# Patient Record
Sex: Male | Born: 2000 | Hispanic: Yes | Marital: Single | Smoking: Never smoker
Health system: Southern US, Community
[De-identification: ages and names within clinical notes are randomized; demographics above are authoritative.]

---
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
## 2020-07-13 ENCOUNTER — Other Ambulatory Visit: Payer: Self-pay

## 2020-07-13 ENCOUNTER — Emergency Department: Payer: Medicaid Other

## 2020-07-13 ENCOUNTER — Inpatient Hospital Stay
Admission: EM | Admit: 2020-07-13 | Discharge: 2020-07-17 | DRG: 316 | Disposition: A | Discharge status: Home / Self Care | Payer: Medicaid Other | Attending: Internal Medicine | Admitting: Internal Medicine

## 2020-07-13 DIAGNOSIS — F329 Major depressive disorder, single episode, unspecified: Secondary | ICD-10-CM | POA: Diagnosis present

## 2020-07-13 DIAGNOSIS — Z20822 Contact with and (suspected) exposure to covid-19: Secondary | ICD-10-CM | POA: Diagnosis present

## 2020-07-13 DIAGNOSIS — R778 Other specified abnormalities of plasma proteins: Secondary | ICD-10-CM | POA: Diagnosis present

## 2020-07-13 DIAGNOSIS — I4510 Unspecified right bundle-branch block: Secondary | ICD-10-CM | POA: Diagnosis present

## 2020-07-13 DIAGNOSIS — B349 Viral infection, unspecified: Secondary | ICD-10-CM | POA: Diagnosis present

## 2020-07-13 DIAGNOSIS — I301 Infective pericarditis: Secondary | ICD-10-CM | POA: Diagnosis present

## 2020-07-13 DIAGNOSIS — I309 Acute pericarditis, unspecified: Secondary | ICD-10-CM | POA: Diagnosis present

## 2020-07-13 DIAGNOSIS — F419 Anxiety disorder, unspecified: Secondary | ICD-10-CM | POA: Diagnosis present

## 2020-07-13 DIAGNOSIS — I319 Disease of pericardium, unspecified: Secondary | ICD-10-CM

## 2020-07-13 DIAGNOSIS — Z79899 Other long term (current) drug therapy: Secondary | ICD-10-CM

## 2020-07-13 DIAGNOSIS — I300 Acute nonspecific idiopathic pericarditis: Secondary | ICD-10-CM | POA: Diagnosis not present

## 2020-07-13 DIAGNOSIS — R7989 Other specified abnormal findings of blood chemistry: Secondary | ICD-10-CM

## 2020-07-13 DIAGNOSIS — R000 Tachycardia, unspecified: Secondary | ICD-10-CM | POA: Diagnosis present

## 2020-07-13 DIAGNOSIS — I10 Essential (primary) hypertension: Secondary | ICD-10-CM | POA: Diagnosis present

## 2020-07-13 DIAGNOSIS — E876 Hypokalemia: Secondary | ICD-10-CM | POA: Diagnosis present

## 2020-07-13 DIAGNOSIS — R9431 Abnormal electrocardiogram [ECG] [EKG]: Secondary | ICD-10-CM | POA: Diagnosis present

## 2020-07-13 LAB — BASIC METABOLIC PANEL
Anion gap: 14 (ref 5–15)
BUN: 13 mg/dL (ref 6–20)
CO2: 20 mmol/L — ABNORMAL LOW (ref 22–32)
Calcium: 9.4 mg/dL (ref 8.9–10.3)
Chloride: 100 mmol/L (ref 98–111)
Creatinine, Ser: 1.03 mg/dL (ref 0.61–1.24)
GFR calc Af Amer: >60 mL/min (ref >60)
GFR calc non Af Amer: >60 mL/min (ref >60)
Glucose, Bld: 122 mg/dL — ABNORMAL HIGH (ref 70–99)
Potassium: 3.2 mmol/L — ABNORMAL LOW (ref 3.5–5.1)
Sodium: 134 mmol/L — ABNORMAL LOW (ref 135–145)

## 2020-07-13 LAB — TROPONIN I (HIGH SENSITIVITY): Troponin I (High Sensitivity): 4684 ng/L (ref <18)

## 2020-07-13 LAB — CBC WITH DIFFERENTIAL/PLATELET
Abs Immature Granulocytes: 0.03 10*3/uL (ref 0.00–0.07)
Basophils Absolute: 0.1 10*3/uL (ref 0.0–0.1)
Basophils Relative: 1 %
Eosinophils Absolute: 0 10*3/uL (ref 0.0–0.5)
Eosinophils Relative: 0 %
HCT: 49.6 % (ref 39.0–52.0)
Hemoglobin: 16.8 g/dL (ref 13.0–17.0)
Immature Granulocytes: 0 %
Lymphocytes Relative: 23 %
Lymphs Abs: 2.2 10*3/uL (ref 0.7–4.0)
MCH: 29.9 pg (ref 26.0–34.0)
MCHC: 33.9 g/dL (ref 30.0–36.0)
MCV: 88.4 fL (ref 80.0–100.0)
Monocytes Absolute: 1.1 10*3/uL — ABNORMAL HIGH (ref 0.1–1.0)
Monocytes Relative: 12 %
Neutro Abs: 6.1 10*3/uL (ref 1.7–7.7)
Neutrophils Relative %: 64 %
Platelets: 289 10*3/uL (ref 150–400)
RBC: 5.61 MIL/uL (ref 4.22–5.81)
RDW: 11.9 % (ref 11.5–15.5)
WBC: 9.5 10*3/uL (ref 4.0–10.5)
nRBC: 0 % (ref 0.0–0.2)

## 2020-07-13 LAB — LACTIC ACID, PLASMA: Lactic Acid, Venous: 1.3 mmol/L (ref 0.5–1.9)

## 2020-07-13 LAB — SARS CORONAVIRUS 2 BY RT PCR (HOSPITAL ORDER, PERFORMED IN ~~LOC~~ HOSPITAL LAB): SARS Coronavirus 2: NEGATIVE

## 2020-07-13 MED ORDER — LORAZEPAM 2 MG/ML IJ SOLN
1.0000 mg | Freq: Once | INTRAMUSCULAR | Status: AC
  Administered 2020-07-13: 1 mg via INTRAVENOUS
  Filled 2020-07-13: qty 1

## 2020-07-13 MED ORDER — HEPARIN SODIUM (PORCINE) 5000 UNIT/ML IJ SOLN
5000.0000 [IU] | Freq: Three times a day (TID) | INTRAMUSCULAR | Status: DC
  Administered 2020-07-13: 5000 [IU] via SUBCUTANEOUS
  Filled 2020-07-13 (×3): qty 1

## 2020-07-13 MED ORDER — TRAZODONE HCL 50 MG PO TABS
50.0000 mg | ORAL_TABLET | Freq: Every day | ORAL | Status: DC
  Administered 2020-07-13 – 2020-07-16 (×4): 50 mg via ORAL
  Filled 2020-07-13 (×4): qty 1

## 2020-07-13 MED ORDER — TRAMADOL HCL 50 MG PO TABS
50.0000 mg | ORAL_TABLET | Freq: Four times a day (QID) | ORAL | Status: DC | PRN
  Administered 2020-07-13 – 2020-07-15 (×2): 50 mg via ORAL
  Filled 2020-07-13 (×2): qty 1

## 2020-07-13 MED ORDER — BUPROPION HCL ER (XL) 150 MG PO TB24
150.0000 mg | ORAL_TABLET | Freq: Every morning | ORAL | Status: DC
  Administered 2020-07-14 – 2020-07-17 (×4): 150 mg via ORAL
  Filled 2020-07-13 (×4): qty 1

## 2020-07-13 MED ORDER — ONDANSETRON HCL 4 MG/2ML IJ SOLN: 4.0000 mg | Freq: Four times a day (QID) | INTRAMUSCULAR | Status: DC | PRN

## 2020-07-13 MED ORDER — SODIUM CHLORIDE 0.9 % IV BOLUS
1000.0000 mL | Freq: Once | INTRAVENOUS | Status: AC
  Administered 2020-07-13: 1000 mL via INTRAVENOUS

## 2020-07-13 MED ORDER — DEXTROSE-NACL 5-0.9 % IV SOLN: INTRAVENOUS | Status: DC

## 2020-07-13 MED ORDER — CLONAZEPAM 1 MG PO TABS
1.0000 mg | ORAL_TABLET | Freq: Every day | ORAL | Status: DC
  Administered 2020-07-13 – 2020-07-14 (×2): 1 mg via ORAL
  Filled 2020-07-13: qty 2
  Filled 2020-07-13: qty 1

## 2020-07-13 MED ORDER — ONDANSETRON HCL 4 MG PO TABS: 4.0000 mg | ORAL_TABLET | Freq: Four times a day (QID) | ORAL | Status: DC | PRN

## 2020-07-13 MED ORDER — IBUPROFEN 400 MG PO TABS: 400.0000 mg | ORAL_TABLET | Freq: Four times a day (QID) | ORAL | Status: DC | PRN

## 2020-07-13 NOTE — ED Provider Notes (Signed)
[facility] Emergency Department Provider Note   ____________________________________________   I have reviewed the triage vital signs and the nursing notes.   HISTORY  Chief Complaint Body aches  History limited by: Not Limited   HPI Adam Sparks is a 19 y.o. male who presents to the emergency department today who presents to the emergency department today with primary complaint of body aches.  The patient states that they have been going on for the past 3 days.  They are severe enough that I have been interfering with the sleep.  Additionally he says that he started having fevers 3 days ago.  He also started developing some shortness of breath.  He denies any true chest pain although states he feels discomfort in his chest.  It has been slightly worse over the past 3 days although he says he has been having anxiety in his chest for quite some time.  Patient denies any known sick contacts.  He states he received the [employer] Covid vaccine. Denies any IVDA.  Records reviewed. Per medical record review patient has a history of HTN.  History reviewed. No pertinent past medical history.  There are no problems to display for this patient.   History reviewed. No pertinent surgical history.  Prior to Admission medications   Not on File    Allergies Patient has no known allergies.  History reviewed. No pertinent family history.  Social History Social History   Tobacco Use  . Smoking status: Never Smoker  . Smokeless tobacco: Never Used  Substance Use Topics  . Alcohol use: Not on file  . Drug use: Not on file    Review of Systems Constitutional: Positive for fevers.  Eyes: No visual changes. ENT: No sore throat. Cardiovascular: Positive for "anxiety" in his chest. Respiratory: Positive for shortness of breath. Gastrointestinal: Positive for nausea, vomiting and diarrhea.   Genitourinary: Negative for dysuria. Musculoskeletal: Positive for body  aches. Skin: Negative for rash. Neurological: Negative for headaches, focal weakness or numbness.  ____________________________________________   PHYSICAL EXAM:  VITAL SIGNS: ED Triage Vitals  Enc Vitals Group     BP 07/13/20 1530 (!) 134/89     Pulse Rate 07/13/20 1530 (!) 116     Resp 07/13/20 1530 18     Temp 07/13/20 1530 100.3 F (37.9 C)     Temp Source 07/13/20 1530 Oral     SpO2 07/13/20 1530 96 %     Weight 07/13/20 1531 190 lb (86.2 kg)     Height 07/13/20 1531 5\' 11"  (1.803 m)     Head Circumference --      Peak Flow --      Pain Score 07/13/20 1531 6   Constitutional: Alert and oriented.  Eyes: Conjunctivae are normal.  ENT      Head: Normocephalic and atraumatic.      Nose: No congestion/rhinnorhea.      Mouth/Throat: Mucous membranes are moist.      Neck: No stridor. Hematological/Lymphatic/Immunilogical: No cervical lymphadenopathy. Cardiovascular: Tachycardic, regular rhythm.  No murmurs, rubs, or gallops.  Respiratory: Normal respiratory effort without tachypnea nor retractions. Breath sounds are clear and equal bilaterally. No wheezes/rales/rhonchi. Gastrointestinal: Soft and non tender. No rebound. No guarding.  Genitourinary: Deferred Musculoskeletal: Normal range of motion in all extremities. No lower extremity edema. Neurologic:  Normal speech and language. No gross focal neurologic deficits are appreciated.  Skin:  Skin is warm, dry and intact. No rash noted. Psychiatric: Mood and affect are normal. Speech and  behavior are normal. Patient exhibits appropriate insight and judgment.  ____________________________________________    LABS (pertinent positives/negatives)  CBC wbc 9.5, hgb 16.8, plt 289 COVID negative BMP wnl except na 134, k 3.2, co2 20, glu 122, cr 1.03 Trop hs 4684 Lactic 1.3 ____________________________________________   EKG  I, Phineas Semen, attending physician, personally viewed and interpreted this EKG  EKG Time:  1525 Rate: 116 Rhythm: sinus tachycardia Axis: normal Intervals: qtc 447 QRS: narrow ST changes: st elevation II, III, aVF, V3-V6 Impression: abnormal ekg   ____________________________________________    RADIOLOGY  CXR No active disease  ____________________________________________   PROCEDURES  Procedures  CRITICAL CARE Performed by: Phineas Semen   Total critical care time: 30 minutes  Critical care time was exclusive of separately billable procedures and treating other patients.  Critical care was necessary to treat or prevent imminent or life-threatening deterioration.  Critical care was time spent personally by me on the following activities: development of treatment plan with patient and/or surrogate as well as nursing, discussions with consultants, evaluation of patient's response to treatment, examination of patient, obtaining history from patient or surrogate, ordering and performing treatments and interventions, ordering and review of laboratory studies, ordering and review of radiographic studies, pulse oximetry and re-evaluation of patient's condition.  ____________________________________________   INITIAL IMPRESSION / ASSESSMENT AND PLAN / ED COURSE  Pertinent labs & imaging results that were available during my care of the patient were reviewed by me and considered in my medical decision making (see chart for details).   Patient presented to the emergency department today because of concerns for a viral type symptoms.  Primary complaint for muscle aches but also complained of fevers, shortness of breath and abdominal complaints.  Patient's EKG showed somewhat diffuse ST elevation.  Troponin was significantly elevated at 4600.  Discussed with cardiology.  At this time think myopericarditis most likely.  Will plan on admission to the hospital service.  ___________________________________________   FINAL CLINICAL IMPRESSION(S) / ED DIAGNOSES  Final  diagnoses:  Viral syndrome  Pericarditis, unspecified chronicity, unspecified type  Elevated troponin     Note: This dictation was prepared with Dragon dictation. Any transcriptional errors that result from this process are unintentional     Phineas Semen, MD 07/13/20 [phone]

## 2020-07-13 NOTE — ED Notes (Signed)
Pt stated that he received the [employer] covid vaccine in May.

## 2020-07-13 NOTE — H&P (Signed)
History and Physical   Adam Sparks HYI:[id number] DOB: November 02, 2001 DOA: 07/13/2020  Referring MD/NP/PA: Dr. Derrill Kay  PCP: System, Pcp Not In   Outpatient Specialists: None  Patient coming from: Home  Chief Complaint: Body aches  HPI: Adam Sparks is a 19 y.o. male with medical history significant of depression with anxiety who had acute viral illness staff syndromes in the last 3 days and not having generalized aches and fever.  Patient has been unable to sleep adequately.  He has problem with sleep and has been on trazodone.  He noted fever and chills.  Now he is having anterior chest pain especially with deep breath.  Also with mobility.  He is now having shortness of breath again.  Denied any IV drug abuse.  He has been vaccinated to Covid with the father vaccine.  Denied any prior cardiac history or any family history of cardiac disease.  Patient was in the ER evaluated and had markedly elevated troponin with ST elevation diffusely throughout the leads.  Cardiology consulted including STEMI team.  Suggestion is patient is having probably post viral pericarditis.  Recommended medical admission with evaluation including echocardiogram.  Cardiology to follow and make further recommendations.  Chest discomfort is more of pain now rated as 5 out of 10.  No radiation.  Worsened with deep breath but relieved by resting comfortably..  ED Course: Temperature 100.3 blood pressure 130/89 pulse 116 respirate of 32 oxygen sat 96% room air.  His white count is 9.5 sodium 134 potassium 3.2 chloride 100 CO2 20.  Troponin is 4684, lactic acid 1.3.  Urinalysis and chest x-ray are all within normal.  EKG showed sinus tachycardia with diffuse ST elevations.  Patient being admitted now with suspected pericarditis.  Review of Systems: As per HPI otherwise 10 point review of systems negative.    History reviewed. No pertinent past medical history.  History reviewed. No pertinent surgical history.   reports that  he has never smoked. He has never used smokeless tobacco. No history on file for alcohol use and drug use.  No Known Allergies  History reviewed. No pertinent family history.   Prior to Admission medications   Medication Sig Start Date End Date Taking? Authorizing Provider  buPROPion (WELLBUTRIN XL) 150 MG 24 hr tablet Take 150 mg by mouth every morning. 06/27/20  Yes [provider]  clonazePAM (KLONOPIN) 1 MG tablet Take 1 mg by mouth at bedtime. 03/28/20  Yes [provider]  traZODone (DESYREL) 50 MG tablet Take 25 tablets by mouth at bedtime.  03/07/20  Yes [provider]    Physical Exam: Vitals:   07/13/20 1930 07/13/20 2000 07/13/20 2030 07/13/20 2100  BP: (!) 132/88 (!) 134/89 (!) 132/86 122/80  Pulse: 89 92 91 90  Resp: (!) 25 (!) 29 (!) 32 (!) 27  Temp:      TempSrc:      SpO2: 97% 96% 97% 97%  Weight:      Height:          Constitutional: Anxious, no distress Vitals:   07/13/20 1930 07/13/20 2000 07/13/20 2030 07/13/20 2100  BP: (!) 132/88 (!) 134/89 (!) 132/86 122/80  Pulse: 89 92 91 90  Resp: (!) 25 (!) 29 (!) 32 (!) 27  Temp:      TempSrc:      SpO2: 97% 96% 97% 97%  Weight:      Height:       Eyes: PERRL, lids and conjunctivae normal ENMT: Mucous membranes are  moist. Posterior pharynx clear of any exudate or lesions.Normal dentition.  Neck: normal, supple, no masses, no thyromegaly Respiratory: clear to auscultation bilaterally, no wheezing, no crackles. Normal respiratory effort. No accessory muscle use.  Cardiovascular: Sinus tachycardia, no murmurs / rubs / gallops. No extremity edema. 2+ pedal pulses. No carotid bruits.  Abdomen: no tenderness, no masses palpated. No hepatosplenomegaly. Bowel sounds positive.  Musculoskeletal: no clubbing / cyanosis. No joint deformity upper and lower extremities. Good ROM, no contractures. Normal muscle tone.  Skin: no rashes, lesions, ulcers. No induration Neurologic: CN 2-12 grossly  intact. Sensation intact, DTR normal. Strength 5/5 in all 4.  Psychiatric: Anxious, awake alert and oriented   Labs on Admission: I have personally reviewed following labs and imaging studies  CBC: Recent Labs  Lab 07/13/20 1544  WBC 9.5  NEUTROABS 6.1  HGB 16.8  HCT 49.6  MCV 88.4  PLT 289   Basic Metabolic Panel: Recent Labs  Lab 07/13/20 1544  NA 134*  K 3.2*  CL 100  CO2 20*  GLUCOSE 122*  BUN 13  CREATININE 1.03  CALCIUM 9.4   GFR: Estimated Creatinine Clearance: 120.8 mL/min (by C-G formula based on SCr of 1.03 mg/dL). Liver Function Tests: No results for input(s): AST, ALT, ALKPHOS, BILITOT, PROT, ALBUMIN in the last 168 hours. No results for input(s): LIPASE, AMYLASE in the last 168 hours. No results for input(s): AMMONIA in the last 168 hours. Coagulation Profile: No results for input(s): INR, PROTIME in the last 168 hours. Cardiac Enzymes: No results for input(s): CKTOTAL, CKMB, CKMBINDEX, TROPONINI in the last 168 hours. BNP (last 3 results) No results for input(s): PROBNP in the last 8760 hours. HbA1C: No results for input(s): HGBA1C in the last 72 hours. CBG: No results for input(s): GLUCAP in the last 168 hours. Lipid Profile: No results for input(s): CHOL, HDL, LDLCALC, TRIG, CHOLHDL, LDLDIRECT in the last 72 hours. Thyroid Function Tests: No results for input(s): TSH, T4TOTAL, FREET4, T3FREE, THYROIDAB in the last 72 hours. Anemia Panel: No results for input(s): VITAMINB12, FOLATE, FERRITIN, TIBC, IRON, RETICCTPCT in the last 72 hours. Urine analysis:    Component Value Date/Time   COLORURINE Yellow 05/11/2014 1655   APPEARANCEUR Clear 05/11/2014 1655   LABSPEC 1.024 05/11/2014 1655   PHURINE 6.0 05/11/2014 1655   GLUCOSEU Negative 05/11/2014 1655   HGBUR Negative 05/11/2014 1655   BILIRUBINUR Negative 05/11/2014 1655   KETONESUR Negative 05/11/2014 1655   PROTEINUR Negative 05/11/2014 1655   NITRITE Negative 05/11/2014 1655    LEUKOCYTESUR Negative 05/11/2014 1655   Sepsis Labs: @LABRCNTIP (procalcitonin:4,lacticidven:4) ) Recent Results (from the past 240 hour(s))  SARS Coronavirus 2 by RT PCR (hospital order, performed in [facility] hospital lab) Nasopharyngeal Nasopharyngeal Swab     Status: None   Collection Time: 07/13/20  3:44 PM   Specimen: Nasopharyngeal Swab  Result Value Ref Range Status   SARS Coronavirus 2 NEGATIVE NEGATIVE Final    Comment: (NOTE) SARS-CoV-2 target nucleic acids are NOT DETECTED.  The SARS-CoV-2 RNA is generally detectable in upper and lower respiratory specimens during the acute phase of infection. The lowest concentration of SARS-CoV-2 viral copies this assay can detect is 250 copies / mL. A negative result does not preclude SARS-CoV-2 infection and should not be used as the sole basis for treatment or other patient management decisions.  A negative result may occur with improper specimen collection / handling, submission of specimen other than nasopharyngeal swab, presence of viral mutation(s) within the areas targeted by this  assay, and inadequate number of viral copies (<250 copies / mL). A negative result must be combined with clinical observations, patient history, and epidemiological information.  Fact Sheet for Patients:   BoilerBrush.com.cy  Fact Sheet for Healthcare Providers: https://pope.com/  This test is not yet approved or  cleared by the [place] FDA and has been authorized for detection and/or diagnosis of SARS-CoV-2 by FDA under an Emergency Use Authorization (EUA).  This EUA will remain in effect (meaning this test can be used) for the duration of the COVID-19 declaration under Section 564(b)(1) of the Act, 21 U.S.C. section 360bbb-3(b)(1), unless the authorization is terminated or revoked sooner.  Performed at [facility], [address] [place], [place] [address]      Radiological  Exams on Admission: DG Chest Portable 1 View  Result Date: 07/13/2020 CLINICAL DATA:  19 year old male with shortness of breath. EXAM: PORTABLE CHEST 1 VIEW COMPARISON:  None. FINDINGS: The heart size and mediastinal contours are within normal limits. Both lungs are clear. The visualized skeletal structures are unremarkable. IMPRESSION: No active disease. Electronically Signed   By: Elgie Collard M.D.   On: 07/13/2020 16:00    EKG: Independently reviewed.  Sinus tachycardia with a rate of 116, diffuse ST elevations.    Assessment/Plan Principal Problem:   Acute pericarditis Active Problems:   Sinus tachycardia   Hypokalemia   Troponin I above reference range     #1 suspected acute pericarditis: Diffusely elevated ST segment suspected to be due to acute pericarditis.  Cardiology following.  Patient will be admitted.  I will put him on some ibuprofen.  Probably recent viral infections leading to that.  Cardiology to follow with echo and make further recommendations.  Cycle his enzymes.  #2 elevated troponins: Probably secondary to pericarditis or less likely ST elevation MI.  Cycle enzymes.  Get the echocardiogram IV fluids and monitor  #3 hypokalemia: Continue to replete.  #4 sinus tachycardia: Patient has suspected cardiac disease.  No evidence of PE based on report.  May still get a CT chest possibly to evaluate.   DVT prophylaxis: Heparin Code Status: Full code Family Communication: Mother at bedside Disposition Plan: Home Consults called: Cardiology  Admission status: Inpatient  Severity of Illness: The appropriate patient status for this patient is INPATIENT. Inpatient status is judged to be reasonable and necessary in order to provide the required intensity of service to ensure the patient's safety. The patient's presenting symptoms, physical exam findings, and initial radiographic and laboratory data in the context of their chronic comorbidities is felt to place them at  high risk for further clinical deterioration. Furthermore, it is not anticipated that the patient will be medically stable for discharge from the hospital within 2 midnights of admission. The following factors support the patient status of inpatient.   " The patient's presenting symptoms include generalized aches and chest pain. " The worrisome physical exam findings include sinus tachycardia. " The initial radiographic and laboratory data are worrisome because of elevated troponin and abnormal EKG. " The chronic co-morbidities include anxiety depression.   * I certify that at the point of admission it is my clinical judgment that the patient will require inpatient hospital care spanning beyond 2 midnights from the point of admission due to high intensity of service, high risk for further deterioration and high frequency of surveillance required.Lonia Blood MD [facility] Pager [phone]  If 7PM-7AM, please contact night-coverage [email or web address] Password [facility] [place] [facility]  07/13/2020, 9:12 PM

## 2020-07-13 NOTE — ED Triage Notes (Signed)
Pt arrives from [facility] for c/o of headache, stomach pain, diarrhea, shob, chest pain and vomiting x 3 days. Pt sitting in wheelchair in NAD, speaking in complete sentences without shob. Skin warm and dry.

## 2020-07-14 ENCOUNTER — Inpatient Hospital Stay
Admit: 2020-07-14 | Discharge: 2020-07-14 | Disposition: A | Discharge status: Home / Self Care | Payer: Medicaid Other | Attending: Internal Medicine | Admitting: Internal Medicine

## 2020-07-14 LAB — ECHOCARDIOGRAM COMPLETE
AR max vel: 2.09 cm2
AV Area VTI: 1.98 cm2
AV Area mean vel: 1.97 cm2
AV Mean grad: 4 mmHg
AV Peak grad: 6.6 mmHg
Ao pk vel: 1.28 m/s
Area-P 1/2: 3.34 cm2
Height: 71 in
S' Lateral: 3.36 cm
Weight: 3040 oz

## 2020-07-14 LAB — CBC
HCT: 45.4 % (ref 39.0–52.0)
Hemoglobin: 15.3 g/dL (ref 13.0–17.0)
MCH: 29.9 pg (ref 26.0–34.0)
MCHC: 33.7 g/dL (ref 30.0–36.0)
MCV: 88.8 fL (ref 80.0–100.0)
Platelets: 300 10*3/uL (ref 150–400)
RBC: 5.11 MIL/uL (ref 4.22–5.81)
RDW: 12.1 % (ref 11.5–15.5)
WBC: 8 10*3/uL (ref 4.0–10.5)
nRBC: 0 % (ref 0.0–0.2)

## 2020-07-14 LAB — COMPREHENSIVE METABOLIC PANEL
ALT: 31 U/L (ref 0–44)
AST: 95 U/L — ABNORMAL HIGH (ref 15–41)
Albumin: 3.8 g/dL (ref 3.5–5.0)
Alkaline Phosphatase: 47 U/L (ref 38–126)
Anion gap: 10 (ref 5–15)
BUN: 10 mg/dL (ref 6–20)
CO2: 27 mmol/L (ref 22–32)
Calcium: 8.6 mg/dL — ABNORMAL LOW (ref 8.9–10.3)
Chloride: 101 mmol/L (ref 98–111)
Creatinine, Ser: 0.92 mg/dL (ref 0.61–1.24)
GFR calc Af Amer: >60 mL/min (ref >60)
GFR calc non Af Amer: >60 mL/min (ref >60)
Glucose, Bld: 113 mg/dL — ABNORMAL HIGH (ref 70–99)
Potassium: 3 mmol/L — ABNORMAL LOW (ref 3.5–5.1)
Sodium: 138 mmol/L (ref 135–145)
Total Bilirubin: 0.7 mg/dL (ref 0.3–1.2)
Total Protein: 7.6 g/dL (ref 6.5–8.1)

## 2020-07-14 LAB — HEPARIN LEVEL (UNFRACTIONATED)
Heparin Unfractionated: 0.82 IU/mL — ABNORMAL HIGH (ref 0.30–0.70)
Heparin Unfractionated: 0.11 IU/mL — ABNORMAL LOW (ref 0.30–0.70)

## 2020-07-14 LAB — HIV ANTIBODY (ROUTINE TESTING W REFLEX): HIV Screen 4th Generation wRfx: NONREACTIVE

## 2020-07-14 LAB — TROPONIN I (HIGH SENSITIVITY)
Troponin I (High Sensitivity): 11901 ng/L (ref <18)
Troponin I (High Sensitivity): 15457 ng/L (ref <18)
Troponin I (High Sensitivity): 11332 ng/L (ref <18)
Troponin I (High Sensitivity): 6403 ng/L (ref <18)

## 2020-07-14 LAB — PROTIME-INR
INR: 1 (ref 0.8–1.2)
Prothrombin Time: 13.1 seconds (ref 11.4–15.2)

## 2020-07-14 LAB — APTT: aPTT: 35 seconds (ref 24–36)

## 2020-07-14 MED ORDER — IBUPROFEN 400 MG PO TABS
800.0000 mg | ORAL_TABLET | Freq: Three times a day (TID) | ORAL | Status: DC
  Administered 2020-07-14 – 2020-07-17 (×10): 800 mg via ORAL
  Filled 2020-07-14 (×10): qty 2

## 2020-07-14 MED ORDER — COLCHICINE 0.6 MG PO TABS
0.6000 mg | ORAL_TABLET | Freq: Every day | ORAL | Status: DC
  Administered 2020-07-14: 0.6 mg via ORAL
  Filled 2020-07-14: qty 1

## 2020-07-14 MED ORDER — HEPARIN (PORCINE) 25000 UT/250ML-% IV SOLN
1400.0000 [IU]/h | INTRAVENOUS | Status: DC
  Administered 2020-07-14: 1050 [IU]/h via INTRAVENOUS
  Administered 2020-07-15: 950 [IU]/h via INTRAVENOUS
  Administered 2020-07-15: 1400 [IU]/h via INTRAVENOUS
  Filled 2020-07-14 (×3): qty 250

## 2020-07-14 MED ORDER — SODIUM CHLORIDE 0.9 % IV SOLN: INTRAVENOUS | Status: DC

## 2020-07-14 MED ORDER — COLCHICINE 0.6 MG PO TABS
0.6000 mg | ORAL_TABLET | Freq: Two times a day (BID) | ORAL | Status: DC
  Administered 2020-07-14 – 2020-07-17 (×6): 0.6 mg via ORAL
  Filled 2020-07-14 (×6): qty 1

## 2020-07-14 MED ORDER — HEPARIN BOLUS VIA INFUSION
4000.0000 [IU] | Freq: Once | INTRAVENOUS | Status: AC
  Administered 2020-07-14: 4000 [IU] via INTRAVENOUS
  Filled 2020-07-14: qty 4000

## 2020-07-14 MED ORDER — MORPHINE SULFATE (PF) 2 MG/ML IV SOLN
2.0000 mg | INTRAVENOUS | Status: DC | PRN
  Administered 2020-07-14: 2 mg via INTRAVENOUS
  Filled 2020-07-14: qty 1

## 2020-07-14 MED ORDER — LORAZEPAM 2 MG/ML IJ SOLN
2.0000 mg | Freq: Once | INTRAMUSCULAR | Status: AC
  Administered 2020-07-14: 2 mg via INTRAVENOUS
  Filled 2020-07-14: qty 1

## 2020-07-14 MED ORDER — POTASSIUM CHLORIDE CRYS ER 20 MEQ PO TBCR
60.0000 meq | EXTENDED_RELEASE_TABLET | Freq: Once | ORAL | Status: AC
  Administered 2020-07-14: 60 meq via ORAL
  Filled 2020-07-14: qty 3

## 2020-07-14 MED ORDER — LORAZEPAM 2 MG PO TABS
2.0000 mg | ORAL_TABLET | Freq: Once | ORAL | Status: AC
  Administered 2020-07-14: 2 mg via ORAL
  Filled 2020-07-14: qty 1

## 2020-07-14 NOTE — Progress Notes (Signed)
PROGRESS NOTE    Adam Sparks  ZES:[id number] DOB: Jul 25, 2001 DOA: 07/13/2020 PCP: System, Pcp Not In    Brief Narrative:  HPI: Adam Sparks is a 19 y.o. male with medical history significant of depression with anxiety who had acute viral illness staff syndromes in the last 3 days and not having generalized aches and fever.  Patient has been unable to sleep adequately.  He has problem with sleep and has been on trazodone.  He noted fever and chills.  Now he is having anterior chest pain especially with deep breath.  Also with mobility.  He is now having shortness of breath again.  Denied any IV drug abuse.  He has been vaccinated to Covid with the father vaccine.  Denied any prior cardiac history or any family history of cardiac disease.  Patient was in the ER evaluated and had markedly elevated troponin with ST elevation diffusely throughout the leads.  Cardiology consulted including STEMI team.  Suggestion is patient is having probably post viral pericarditis.  Recommended medical admission with evaluation including echocardiogram.  Cardiology to follow and make further recommendations.  Chest discomfort is more of pain now rated as 5 out of 10.  No radiation.  Worsened with deep breath but relieved by resting comfortably..  7/30: Patient seen and examined.  Still endorsing chest pain.  Troponins continue to uptrend.  Last troponin greater than 15,000.   Assessment & Plan:   Principal Problem:   Acute pericarditis Active Problems:   Sinus tachycardia   Hypokalemia   Troponin I above reference range  Suspected acute myopericarditis Initial EKG demonstrates mild diffusely elevated ST segment elevation Troponin initially 4000.  Was not checked until today.  Now up trending to 11,000 and subsequently 15,000 Cardiology consulted, communicated with Dr. Gwen Pounds Echocardiogram done today, pending read Plan: Transfer to PCU Continue to trend troponins to peak Initiate heparin GTT Ibuprofen  800 mg scheduled 3 times daily Colchicine 0.6 mg p.o. twice daily Telemetry monitoring N.p.o. status for now Pending any further cardiac recommendations regarding ischemic evaluation  Elevated troponins Likely secondary to myopericarditis Difficult to exclude ACS Continue to trend enzymes Heparin GTT Follow-up echocardiogram  Hypokalemia Check daily BMP, replete as necessary   DVT prophylaxis: Heparin GTT Code Status: Full Family Communication: None today Disposition Plan: Status is: Inpatient  Remains inpatient appropriate because:Inpatient level of care appropriate due to severity of illness   Dispo: The patient is from: Home              Anticipated d/c is to: Home              Anticipated d/c date is: 2 days              Patient currently is not medically stable to d/c.  Suspected acute myopericarditis.  Uptrending troponins.  Pending cardiology evaluation.       Consultants:   Cardiology-[facility]  Procedures:   2D echo  Antimicrobials:   None   Subjective: Patient seen and examined.  Complains of midsternal chest pain.  Worse when sitting up.  Objective: Vitals:   07/14/20 0628 07/14/20 0629 07/14/20 0729 07/14/20 0845  BP: 126/80 126/84 124/74 (!) 138/90  Pulse: 99 103 105 103  Resp:  22 16 18   Temp: 99 F (37.2 C) 99.1 F (37.3 C) 99.9 F (37.7 C) 99 F (37.2 C)  TempSrc:  Oral Oral Oral  SpO2: 98%  99% 99%  Weight:      Height:  Intake/Output Summary (Last 24 hours) at 07/14/2020 1147 Last data filed at 07/14/2020 0400 Gross per 24 hour  Intake 577.4 ml  Output 300 ml  Net 277.4 ml   Filed Weights   07/13/20 1531  Weight: 86.2 kg    Examination:  General exam: Mild distress due to pain Respiratory system: Clear to auscultation. Respiratory effort normal. Cardiovascular system: Tachycardic, regular rhythm, no murmurs, no pedal edema Gastrointestinal system: Abdomen is nondistended, soft and nontender. No organomegaly or  masses felt. Normal bowel sounds heard. Central nervous system: Alert and oriented. No focal neurological deficits. Extremities: Symmetric 5 x 5 power. Skin: No rashes, lesions or ulcers Psychiatry: Judgement and insight appear normal. Mood & affect appropriate.     Data Reviewed: I have personally reviewed following labs and imaging studies  CBC: Recent Labs  Lab 07/13/20 1544 07/14/20 0344  WBC 9.5 8.0  NEUTROABS 6.1  --   HGB 16.8 15.3  HCT 49.6 45.4  MCV 88.4 88.8  PLT 289 300   Basic Metabolic Panel: Recent Labs  Lab 07/13/20 1544 07/14/20 0344  NA 134* 138  K 3.2* 3.0*  CL 100 101  CO2 20* 27  GLUCOSE 122* 113*  BUN 13 10  CREATININE 1.03 0.92  CALCIUM 9.4 8.6*   GFR: Estimated Creatinine Clearance: 135.3 mL/min (by C-G formula based on SCr of 0.92 mg/dL). Liver Function Tests: Recent Labs  Lab 07/14/20 0344  AST 95*  ALT 31  ALKPHOS 47  BILITOT 0.7  PROT 7.6  ALBUMIN 3.8   No results for input(s): LIPASE, AMYLASE in the last 168 hours. No results for input(s): AMMONIA in the last 168 hours. Coagulation Profile: Recent Labs  Lab 07/14/20 0920  INR 1.0   Cardiac Enzymes: No results for input(s): CKTOTAL, CKMB, CKMBINDEX, TROPONINI in the last 168 hours. BNP (last 3 results) No results for input(s): PROBNP in the last 8760 hours. HbA1C: No results for input(s): HGBA1C in the last 72 hours. CBG: No results for input(s): GLUCAP in the last 168 hours. Lipid Profile: No results for input(s): CHOL, HDL, LDLCALC, TRIG, CHOLHDL, LDLDIRECT in the last 72 hours. Thyroid Function Tests: No results for input(s): TSH, T4TOTAL, FREET4, T3FREE, THYROIDAB in the last 72 hours. Anemia Panel: No results for input(s): VITAMINB12, FOLATE, FERRITIN, TIBC, IRON, RETICCTPCT in the last 72 hours. Sepsis Labs: Recent Labs  Lab 07/13/20 1545  LATICACIDVEN 1.3    Recent Results (from the past 240 hour(s))  SARS Coronavirus 2 by RT PCR (hospital order,  performed in [facility] hospital lab) Nasopharyngeal Nasopharyngeal Swab     Status: None   Collection Time: 07/13/20  3:44 PM   Specimen: Nasopharyngeal Swab  Result Value Ref Range Status   SARS Coronavirus 2 NEGATIVE NEGATIVE Final    Comment: (NOTE) SARS-CoV-2 target nucleic acids are NOT DETECTED.  The SARS-CoV-2 RNA is generally detectable in upper and lower respiratory specimens during the acute phase of infection. The lowest concentration of SARS-CoV-2 viral copies this assay can detect is 250 copies / mL. A negative result does not preclude SARS-CoV-2 infection and should not be used as the sole basis for treatment or other patient management decisions.  A negative result may occur with improper specimen collection / handling, submission of specimen other than nasopharyngeal swab, presence of viral mutation(s) within the areas targeted by this assay, and inadequate number of viral copies (<250 copies / mL). A negative result must be combined with clinical observations, patient history, and epidemiological information.  Fact  Sheet for Patients:   BoilerBrush.com.cy  Fact Sheet for Healthcare Providers: https://pope.com/  This test is not yet approved or  cleared by the [place] FDA and has been authorized for detection and/or diagnosis of SARS-CoV-2 by FDA under an Emergency Use Authorization (EUA).  This EUA will remain in effect (meaning this test can be used) for the duration of the COVID-19 declaration under Section 564(b)(1) of the Act, 21 U.S.C. section 360bbb-3(b)(1), unless the authorization is terminated or revoked sooner.  Performed at [facility], [address] [place], [place] [address]          Radiology Studies: DG Chest Portable 1 View  Result Date: 07/13/2020 CLINICAL DATA:  19 year old male with shortness of breath. EXAM: PORTABLE CHEST 1 VIEW COMPARISON:  None. FINDINGS: The heart  size and mediastinal contours are within normal limits. Both lungs are clear. The visualized skeletal structures are unremarkable. IMPRESSION: No active disease. Electronically Signed   By: Elgie Collard M.D.   On: 07/13/2020 16:00        Scheduled Meds: . buPROPion  150 mg Oral q morning - 10a  . clonazePAM  1 mg Oral QHS  . colchicine  0.6 mg Oral Daily  . ibuprofen  800 mg Oral TID  . LORazepam  2 mg Intravenous Once  . traZODone  50 mg Oral QHS   Continuous Infusions: . sodium chloride 100 mL/hr at 07/14/20 0953  . heparin 1,050 Units/hr (07/14/20 0933)     LOS: 1 day    Time spent: 25 minutes    Tresa Moore, MD [facility] Pager [phone]  If 7PM-7AM, please contact night-coverage 07/14/2020, 11:47 AM

## 2020-07-14 NOTE — Progress Notes (Signed)
ANTICOAGULATION CONSULT NOTE - Initial Consult  Pharmacy Consult for Heparin Indication: chest pain/ACS  No Known Allergies  Patient Measurements: Height: 5\' 11"  (180.3 cm) Weight: 86.2 kg (190 lb) IBW/kg (Calculated) : 75.3 Heparin Dosing Weight: 86.2 kg   Vital Signs: Temp: 98.5 F (36.9 C) (07/30 1924) Temp Source: Oral (07/30 1924) BP: 107/60 (07/30 1924) Pulse Rate: 85 (07/30 1924)  Labs: Recent Labs    07/13/20 1544 07/13/20 1544 07/14/20 0344 07/14/20 0746 07/14/20 0920 07/14/20 1556 07/14/20 1758 07/14/20 2233  HGB 16.8  --  15.3  --   --   --   --   --   HCT 49.6  --  45.4  --   --   --   --   --   PLT 289  --  300  --   --   --   --   --   APTT  --   --   --   --  35  --   --   --   LABPROT  --   --   --   --  13.1  --   --   --   INR  --   --   --   --  1.0  --   --   --   HEPARINUNFRC  --   --   --   --   --  0.82*  --  0.11*  CREATININE 1.03  --  0.92  --   --   --   --   --   TROPONINIHS 4,684*   < >  --  11,901* 15,457*  --  11,332*  --    < > = values in this interval not displayed.    Estimated Creatinine Clearance: 135.3 mL/min (by C-G formula based on SCr of 0.92 mg/dL).    Assessment: 19 yo male with elevated troponins and chest pain, starting on heparin drip. No oral anticoagulants on PTA med list. Last dose of SQ heparin 7/29 at 2359.  7/30 1556 HL 0.82, rate decreased to 850 units/hr  Goal of Therapy:  Heparin level 0.3-0.7 units/ml Monitor platelets by anticoagulation protocol: Yes   Plan:  07/30 @ 2300 HL 0.11 subtherapeutic. Will increase rate to 950 units/hr and will recheck HL at 0500, CBC stable will continue to monitor.  8/30, PharmD, BCPS Clinical Pharmacist 07/14/2020 11:15 PM

## 2020-07-14 NOTE — Plan of Care (Signed)
  Problem: Health Behavior/Discharge Planning: Goal: Ability to manage health-related needs will improve Outcome: Progressing   

## 2020-07-14 NOTE — Progress Notes (Signed)
ANTICOAGULATION CONSULT NOTE - Initial Consult  Pharmacy Consult for Heparin Indication: chest pain/ACS  No Known Allergies  Patient Measurements: Height: 5\' 11"  (180.3 cm) Weight: 86.2 kg (190 lb) IBW/kg (Calculated) : 75.3 Heparin Dosing Weight: 86.2 kg   Vital Signs: Temp: 98.3 F (36.8 C) (07/30 1600) Temp Source: Oral (07/30 1600) BP: 103/67 (07/30 1600) Pulse Rate: 86 (07/30 1600)  Labs: Recent Labs    07/13/20 1544 07/14/20 0344 07/14/20 0746 07/14/20 0920 07/14/20 1556  HGB 16.8 15.3  --   --   --   HCT 49.6 45.4  --   --   --   PLT 289 300  --   --   --   APTT  --   --   --  35  --   LABPROT  --   --   --  13.1  --   INR  --   --   --  1.0  --   HEPARINUNFRC  --   --   --   --  0.82*  CREATININE 1.03 0.92  --   --   --   TROPONINIHS 4,684*  --  11,901* 15,457*  --     Estimated Creatinine Clearance: 135.3 mL/min (by C-G formula based on SCr of 0.92 mg/dL).    Assessment: 19 yo male with elevated troponins and chest pain, starting on heparin drip. No oral anticoagulants on PTA med list. Last dose of SQ heparin 7/29 at 2359.  7/30 1556 HL 0.82, rate decreased to 850 units/hr  Goal of Therapy:  Heparin level 0.3-0.7 units/ml Monitor platelets by anticoagulation protocol: Yes   Plan:  7/30 15:56 HL 0.82 is supratherapeutic. Will decrease rate to 850 units/hr. Recheck Heparin level in 6 hours. Daily CBC while on Heparin drip.  Pharmacy will continue to follow.   8/30, PharmD, BCPS 07/14/2020 5:07 PM

## 2020-07-14 NOTE — Progress Notes (Signed)
ANTICOAGULATION CONSULT NOTE - Initial Consult  Pharmacy Consult for Heparin Indication: chest pain/ACS  No Known Allergies  Patient Measurements: Height: 5\' 11"  (180.3 cm) Weight: 86.2 kg (190 lb) IBW/kg (Calculated) : 75.3 Heparin Dosing Weight: 86.2 kg   Vital Signs: Temp: 99 F (37.2 C) (07/30 0845) Temp Source: Oral (07/30 0845) BP: 138/90 (07/30 0845) Pulse Rate: 103 (07/30 0845)  Labs: Recent Labs    07/13/20 1544 07/14/20 0344 07/14/20 0746  HGB 16.8 15.3  --   HCT 49.6 45.4  --   PLT 289 300  --   CREATININE 1.03 0.92  --   TROPONINIHS 4,684*  --  11,901*    Estimated Creatinine Clearance: 135.3 mL/min (by C-G formula based on SCr of 0.92 mg/dL).    Assessment: 19 yo male with elevated troponins and chest pain, starting on heparin drip. No oral anticoagulants on PTA med list. Last dose of SQ heparin 7/29 at 2359.  Goal of Therapy:  Heparin level 0.3-0.7 units/ml Monitor platelets by anticoagulation protocol: Yes   Plan:  Stat aPTT and INR for baseline labs Heparin bolus 4000 units IV x1 then heparin drip at 1050 units/hr (=10.5 ml/hr) Heparin level 6h after start of heparin drip CBC in AM - continue to monitor s/sx of bleeding.  Pharmacy will continue to follow.   8/29 07/14/2020,9:06 AM

## 2020-07-14 NOTE — Consult Note (Signed)
[facility]  Patient ID: Adam Sparks, MRN: [id number], DOB/AGE: 21/02/2001 21 y.o. Admit date: 07/13/2020   Date of Consult: 07/14/2020 Primary Physician: System, Pcp Not In Primary Cardiologist: None  Chief Complaint:  Chief Complaint  Patient presents with  . Headache  . Abdominal Pain   Reason for Consult: Chest pain with abnormal EKG  HPI: 19 y.o. male with no cardiovascular history or family history of cardiovascular disease or risk factors for cardiovascular abnormalities who has had a viral type illness symptoms for the last 3 days culminating in some chest discomfort pressure in his chest shortness of breath as well.  The patient finally could not stand all the symptoms and was seen in the emergency room.  At that time the patient had an EKG showing normal sinus rhythm incomplete right bundle branch block and diffuse ST elevations of the inferior and anterior precordial leads with no current evidence of PR depression.  The patient has had subsequent increase in troponin levels to 15 457 consistent with myocardial injury.  The patient's diffuse symptoms have persisted despite some heparin use at this time.  Ibuprofen has also been given for which has not significantly changed his status.  Echocardiogram has performed showing normal LV systolic function with ejection fraction of 45 to 50% and no evidence of segmental wall motion abnormalities and no evidence of significant valvular heart disease.  Currently the patient feels slightly better  History reviewed. No pertinent past medical history.    Surgical History: History reviewed. No pertinent surgical history.   Home Meds: Prior to Admission medications   Medication Sig Start Date End Date Taking? Authorizing Provider  buPROPion (WELLBUTRIN XL) 150 MG 24 hr tablet Take 150 mg by mouth every morning. 06/27/20  Yes [provider]  clonazePAM (KLONOPIN) 1 MG tablet Take 1 mg by mouth at bedtime.  03/28/20  Yes [provider]  traZODone (DESYREL) 50 MG tablet Take 25 tablets by mouth at bedtime.  03/07/20  Yes [provider]    Inpatient Medications:  . buPROPion  150 mg Oral q morning - 10a  . clonazePAM  1 mg Oral QHS  . colchicine  0.6 mg Oral BID  . ibuprofen  800 mg Oral TID  . LORazepam  2 mg Intravenous Once  . traZODone  50 mg Oral QHS   . sodium chloride 100 mL/hr at 07/14/20 0953  . heparin 1,050 Units/hr (07/14/20 0933)    Allergies: No Known Allergies  Social History   Socioeconomic History  . Marital status: Single    Spouse name: Not on file  . Number of children: Not on file  . Years of education: Not on file  . Highest education level: Not on file  Occupational History  . Not on file  Tobacco Use  . Smoking status: Never Smoker  . Smokeless tobacco: Never Used  Substance and Sexual Activity  . Alcohol use: Not on file  . Drug use: Not on file  . Sexual activity: Not on file  Other Topics Concern  . Not on file  Social History Narrative  . Not on file   Social Determinants of Health   Financial Resource Strain:   . Difficulty of Paying Living Expenses:   Food Insecurity:   . Worried About Programme researcher, broadcasting/film/videounning Out of Food in the Last Year:   . Baristaan Out of Food in the Last Year:   Transportation Needs:   . Freight forwarderLack of Transportation (Medical):   Marland Kitchen. Lack  of Transportation (Non-Medical):   Physical Activity:   . Days of Exercise per Week:   . Minutes of Exercise per Session:   Stress:   . Feeling of Stress :   Social Connections:   . Frequency of Communication with Friends and Family:   . Frequency of Social Gatherings with Friends and Family:   . Attends Religious Services:   . Active Member of Clubs or Organizations:   . Attends Banker Meetings:   Marland Kitchen Marital Status:   Intimate Partner Violence:   . Fear of Current or Ex-Partner:   . Emotionally Abused:   Marland Kitchen Physically Abused:   . Sexually Abused:      History  reviewed. No pertinent family history.   Review of Systems Positive for chest pain shortness of breath Negative for: General:  chills, fever, night sweats or weight changes.  Cardiovascular: PND orthopnea syncope dizziness  Dermatological skin lesions rashes Respiratory: Cough congestion Urologic: Frequent urination urination at night and hematuria Abdominal: negative for nausea, vomiting, diarrhea, bright red blood per rectum, melena, or hematemesis Neurologic: negative for visual changes, and/or hearing changes  All other systems reviewed and are otherwise negative except as noted above.  Labs: No results for input(s): CKTOTAL, CKMB, TROPONINI in the last 72 hours. Lab Results  Component Value Date   WBC 8.0 07/14/2020   HGB 15.3 07/14/2020   HCT 45.4 07/14/2020   MCV 88.8 07/14/2020   PLT 300 07/14/2020    Recent Labs  Lab 07/14/20 0344  NA 138  K 3.0*  CL 101  CO2 27  BUN 10  CREATININE 0.92  CALCIUM 8.6*  PROT 7.6  BILITOT 0.7  ALKPHOS 47  ALT 31  AST 95*  GLUCOSE 113*   No results found for: CHOL, HDL, LDLCALC, TRIG No results found for: DDIMER  Radiology/Studies:  DG Chest Portable 1 View  Result Date: 07/13/2020 CLINICAL DATA:  19 year old male with shortness of breath. EXAM: PORTABLE CHEST 1 VIEW COMPARISON:  None. FINDINGS: The heart size and mediastinal contours are within normal limits. Both lungs are clear. The visualized skeletal structures are unremarkable. IMPRESSION: No active disease. Electronically Signed   By: Elgie Collard M.D.   On: 07/13/2020 16:00   ECHOCARDIOGRAM COMPLETE  Result Date: 07/14/2020    ECHOCARDIOGRAM REPORT   Patient Name:   ORMOND LAZO Date of Exam: 07/14/2020 Medical Rec #:  [id number]    Height:       71.0 in Accession #:    [id number]   Weight:       190.0 lb Date of Birth:  Apr 26, 2001     BSA:          2.063 m Patient Age:    19 years     BP:           138/90 mmHg Patient Gender: M            HR:           103 bpm. Exam  Location:  [facility] Procedure: 2D Echo, Cardiac Doppler and Color Doppler Indications:     Pericardial effusion 423.9  History:         Patient has no prior history of Echocardiogram examinations. No                  medical history on file.  Sonographer:     Cristela Blue RDCS (AE) Referring Phys:  [id number] Rometta Emery Diagnosing Phys: Arnoldo Hooker MD IMPRESSIONS  1. Left  ventricular ejection fraction, by estimation, is 45 to 50%. The left ventricle has mildly decreased function. The left ventricle has no regional wall motion abnormalities. Left ventricular diastolic parameters were normal.  2. Right ventricular systolic function is normal. The right ventricular size is normal. There is normal pulmonary artery systolic pressure.  3. The mitral valve is normal in structure. Trivial mitral valve regurgitation.  4. The aortic valve is normal in structure. Aortic valve regurgitation is trivial. FINDINGS  Left Ventricle: Left ventricular ejection fraction, by estimation, is 45 to 50%. The left ventricle has mildly decreased function. The left ventricle has no regional wall motion abnormalities. The left ventricular internal cavity size was normal in size. There is no left ventricular hypertrophy. Left ventricular diastolic parameters were normal. Right Ventricle: The right ventricular size is normal. No increase in right ventricular wall thickness. Right ventricular systolic function is normal. There is normal pulmonary artery systolic pressure. The tricuspid regurgitant velocity is 1.36 m/s, and  with an assumed right atrial pressure of 10 mmHg, the estimated right ventricular systolic pressure is 17.4 mmHg. Left Atrium: Left atrial size was normal in size. Right Atrium: Right atrial size was normal in size. Pericardium: There is no evidence of pericardial effusion. Mitral Valve: The mitral valve is normal in structure. Trivial mitral valve regurgitation. Tricuspid Valve: The tricuspid valve is normal in structure.  Tricuspid valve regurgitation is trivial. Aortic Valve: The aortic valve is normal in structure. Aortic valve regurgitation is trivial. Aortic valve mean gradient measures 4.0 mmHg. Aortic valve peak gradient measures 6.6 mmHg. Aortic valve area, by VTI measures 1.98 cm. Pulmonic Valve: The pulmonic valve was normal in structure. Pulmonic valve regurgitation is not visualized. Aorta: The aortic root and ascending aorta are structurally normal, with no evidence of dilitation. IAS/Shunts: No atrial level shunt detected by color flow Doppler.  LEFT VENTRICLE PLAX 2D LVIDd:         4.64 cm  Diastology LVIDs:         3.36 cm  LV e' lateral:   12.10 cm/s LV PW:         0.90 cm  LV E/e' lateral: 7.2 LV IVS:        0.89 cm  LV e' medial:    12.70 cm/s LVOT diam:     2.00 cm  LV E/e' medial:  6.9 LV SV:         44 LV SV Index:   21 LVOT Area:     3.14 cm  RIGHT VENTRICLE RV S prime:     11.20 cm/s TAPSE (M-mode): 3.4 cm LEFT ATRIUM             Index       RIGHT ATRIUM           Index LA diam:        3.20 cm 1.55 cm/m  RA Area:     19.60 cm LA Vol (A2C):   47.1 ml 22.83 ml/m RA Volume:   58.90 ml  28.55 ml/m LA Vol (A4C):   38.1 ml 18.47 ml/m LA Biplane Vol: 45.4 ml 22.01 ml/m  AORTIC VALVE                   PULMONIC VALVE AV Area (Vmax):    2.09 cm    PV Vmax:        0.84 m/s AV Area (Vmean):   1.97 cm    PV Peak grad:   2.8 mmHg AV Area (VTI):  1.98 cm    RVOT Peak grad: 5 mmHg AV Vmax:           128.00 cm/s AV Vmean:          93.600 cm/s AV VTI:            0.224 m AV Peak Grad:      6.6 mmHg AV Mean Grad:      4.0 mmHg LVOT Vmax:         85.00 cm/s LVOT Vmean:        58.800 cm/s LVOT VTI:          0.141 m LVOT/AV VTI ratio: 0.63  AORTA Ao Root diam: 2.80 cm MITRAL VALVE               TRICUSPID VALVE MV Area (PHT): 3.34 cm    TR Peak grad:   7.4 mmHg MV Decel Time: 227 msec    TR Vmax:        136.00 cm/s MV E velocity: 87.30 cm/s MV A velocity: 31.00 cm/s  SHUNTS MV E/A ratio:  2.82        Systemic VTI:  0.14  m                            Systemic Diam: 2.00 cm Arnoldo Hooker MD Electronically signed by Arnoldo Hooker MD Signature Date/Time: 07/14/2020/12:19:42 PM    Final     EKG: Normal sinus rhythm with incomplete right bundle blanch block and diffuse upsloping ST elevation  Weights: Filed Weights   07/13/20 1531  Weight: 86.2 kg     Physical Exam: Blood pressure (!) 138/90, pulse 103, temperature 99 F (37.2 C), temperature source Oral, resp. rate 18, height 5\' 11"  (1.803 m), weight 86.2 kg, SpO2 99 %. Body mass index is 26.5 kg/m. General: Well developed, well nourished, in no acute distress. Head eyes ears nose throat: Normocephalic, atraumatic, sclera non-icteric, no xanthomas, nares are without discharge. No apparent thyromegaly and/or mass  Lungs: Normal respiratory effort.  no wheezes, no rales, no rhonchi.  Heart: RRR with normal S1 S2. no murmur gallop, no rub, PMI is normal size and placement, carotid upstroke normal without bruit, jugular venous pressure is normal Abdomen: Soft, non-tender, non-distended with normoactive bowel sounds. No hepatomegaly. No rebound/guarding. No obvious abdominal masses. Abdominal aorta is normal size without bruit Extremities: No edema. no cyanosis, no clubbing, no ulcers  Peripheral : 2+ bilateral upper extremity pulses, 2+ bilateral femoral pulses, 2+ bilateral dorsal pedal pulse Neuro: Alert and oriented. No facial asymmetry. No focal deficit. Moves all extremities spontaneously. Musculoskeletal: Normal muscle tone without kyphosis Psych:  Responds to questions appropriately with a normal affect.    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  Continue symptomatic relief of viral symptoms and chest discomfort 2.   Reasonable for intravenous heparin to be used for 48 hours 3.  Repeat EKG and troponin levels today for further evaluation of changes and need for further potential intervention 4.  Will further consider cardiac MRI for primary's diagnosis of myocarditis 5.  Further treatment options after above  Signed, 36 M.D. [facility] 07/14/2020, 12:46 PM

## 2020-07-14 NOTE — Plan of Care (Signed)
Pt arrived to room 242 and oriented to safety features of the room.  Education provided regarding pericarditis, lab values, heparin infusion, and risk for bleeding.  Understanding validated through teach back.  Pt currently free of CP, SOB, N/V, and abdominal pain.    Problem: Health Behavior/Discharge Planning: Goal: Ability to manage health-related needs will improve Outcome: Progressing   Problem: Education: Goal: Knowledge of General Education information will improve Description: Including pain rating scale, medication(s)/side effects and non-pharmacologic comfort measures Outcome: Progressing   Problem: Health Behavior/Discharge Planning: Goal: Ability to manage health-related needs will improve Outcome: Progressing   Problem: Clinical Measurements: Goal: Ability to maintain clinical measurements within normal limits will improve Outcome: Progressing Goal: Will remain free from infection Outcome: Progressing Goal: Diagnostic test results will improve Outcome: Progressing Goal: Respiratory complications will improve Outcome: Progressing Goal: Cardiovascular complication will be avoided Outcome: Progressing   Problem: Activity: Goal: Risk for activity intolerance will decrease Outcome: Progressing   Problem: Nutrition: Goal: Adequate nutrition will be maintained Outcome: Progressing   Problem: Coping: Goal: Level of anxiety will decrease Outcome: Progressing   Problem: Elimination: Goal: Will not experience complications related to bowel motility Outcome: Progressing Goal: Will not experience complications related to urinary retention Outcome: Progressing   Problem: Pain Managment: Goal: General experience of comfort will improve Outcome: Progressing   Problem: Safety: Goal: Ability to remain free from injury will improve Outcome: Progressing   Problem: Skin Integrity: Goal: Risk for impaired skin integrity will decrease Outcome: Progressing   Problem:  Education: Goal: Understanding of cardiac disease, CV risk reduction, and recovery process will improve Outcome: Progressing Goal: Individualized Educational Video(s) Outcome: Progressing   Problem: Activity: Goal: Ability to tolerate increased activity will improve Outcome: Progressing   Problem: Cardiac: Goal: Ability to achieve and maintain adequate cardiovascular perfusion will improve Outcome: Progressing   Problem: Health Behavior/Discharge Planning: Goal: Ability to safely manage health-related needs after discharge will improve Outcome: Progressing

## 2020-07-14 NOTE — Progress Notes (Signed)
*  PRELIMINARY RESULTS* Echocardiogram 2D Echocardiogram has been performed.  Cristela Blue 07/14/2020, 9:28 AM

## 2020-07-14 NOTE — Plan of Care (Signed)
Dr. Informed of Critical Troponin result: 11,901.

## 2020-07-15 LAB — HEPARIN LEVEL (UNFRACTIONATED)
Heparin Unfractionated: 0.1 IU/mL — ABNORMAL LOW (ref 0.30–0.70)
Heparin Unfractionated: 0.31 IU/mL (ref 0.30–0.70)
Heparin Unfractionated: 0.17 IU/mL — ABNORMAL LOW (ref 0.30–0.70)

## 2020-07-15 LAB — BASIC METABOLIC PANEL
Anion gap: 7 (ref 5–15)
BUN: 13 mg/dL (ref 6–20)
CO2: 22 mmol/L (ref 22–32)
Calcium: 8.3 mg/dL — ABNORMAL LOW (ref 8.9–10.3)
Chloride: 105 mmol/L (ref 98–111)
Creatinine, Ser: 0.89 mg/dL (ref 0.61–1.24)
GFR calc Af Amer: >60 mL/min (ref >60)
GFR calc non Af Amer: >60 mL/min (ref >60)
Glucose, Bld: 94 mg/dL (ref 70–99)
Potassium: 3.8 mmol/L (ref 3.5–5.1)
Sodium: 134 mmol/L — ABNORMAL LOW (ref 135–145)

## 2020-07-15 LAB — CBC WITH DIFFERENTIAL/PLATELET
Abs Immature Granulocytes: 0.02 10*3/uL (ref 0.00–0.07)
Basophils Absolute: 0 10*3/uL (ref 0.0–0.1)
Basophils Relative: 1 %
Eosinophils Absolute: 0.2 10*3/uL (ref 0.0–0.5)
Eosinophils Relative: 2 %
HCT: 37.3 % — ABNORMAL LOW (ref 39.0–52.0)
Hemoglobin: 13.1 g/dL (ref 13.0–17.0)
Immature Granulocytes: 0 %
Lymphocytes Relative: 39 %
Lymphs Abs: 2.6 10*3/uL (ref 0.7–4.0)
MCH: 31 pg (ref 26.0–34.0)
MCHC: 35.1 g/dL (ref 30.0–36.0)
MCV: 88.2 fL (ref 80.0–100.0)
Monocytes Absolute: 1 10*3/uL (ref 0.1–1.0)
Monocytes Relative: 14 %
Neutro Abs: 3 10*3/uL (ref 1.7–7.7)
Neutrophils Relative %: 44 %
Platelets: 279 10*3/uL (ref 150–400)
RBC: 4.23 MIL/uL (ref 4.22–5.81)
RDW: 12.5 % (ref 11.5–15.5)
WBC: 6.9 10*3/uL (ref 4.0–10.5)
nRBC: 0 % (ref 0.0–0.2)

## 2020-07-15 LAB — TROPONIN I (HIGH SENSITIVITY)
Troponin I (High Sensitivity): 6935 ng/L (ref <18)
Troponin I (High Sensitivity): 10454 ng/L (ref <18)
Troponin I (High Sensitivity): 9818 ng/L (ref <18)

## 2020-07-15 LAB — MAGNESIUM: Magnesium: 2.1 mg/dL (ref 1.7–2.4)

## 2020-07-15 MED ORDER — CLONAZEPAM 1 MG PO TABS
1.0000 mg | ORAL_TABLET | Freq: Every evening | ORAL | Status: DC | PRN
  Administered 2020-07-15: 1 mg via ORAL
  Filled 2020-07-15: qty 1

## 2020-07-15 MED ORDER — HEPARIN BOLUS VIA INFUSION
2400.0000 [IU] | Freq: Once | INTRAVENOUS | Status: AC
  Administered 2020-07-15: 2400 [IU] via INTRAVENOUS
  Filled 2020-07-15: qty 2400

## 2020-07-15 MED ORDER — GUAIFENESIN-DM 100-10 MG/5ML PO SYRP
5.0000 mL | ORAL_SOLUTION | ORAL | Status: DC | PRN
  Administered 2020-07-15: 5 mL via ORAL
  Filled 2020-07-15: qty 5

## 2020-07-15 MED ORDER — HEPARIN BOLUS VIA INFUSION
2600.0000 [IU] | Freq: Once | INTRAVENOUS | Status: AC
  Administered 2020-07-15: 2600 [IU] via INTRAVENOUS
  Filled 2020-07-15: qty 2600

## 2020-07-15 NOTE — Progress Notes (Signed)
PROGRESS NOTE    Adam Sparks  ZOX:[id number] DOB: 2001-02-21 DOA: 07/13/2020 PCP: System, Pcp Not In    Brief Narrative:  HPI: Adam Sparks is a 19 y.o. male with medical history significant of depression with anxiety who had acute viral illness staff syndromes in the last 3 days and not having generalized aches and fever.  Patient has been unable to sleep adequately.  He has problem with sleep and has been on trazodone.  He noted fever and chills.  Now he is having anterior chest pain especially with deep breath.  Also with mobility.  He is now having shortness of breath again.  Denied any IV drug abuse.  He has been vaccinated to Covid with the father vaccine.  Denied any prior cardiac history or any family history of cardiac disease.  Patient was in the ER evaluated and had markedly elevated troponin with ST elevation diffusely throughout the leads.  Cardiology consulted including STEMI team.  Suggestion is patient is having probably post viral pericarditis.  Recommended medical admission with evaluation including echocardiogram.  Cardiology to follow and make further recommendations.  Chest discomfort is more of pain now rated as 5 out of 10.  No radiation.  Worsened with deep breath but relieved by resting comfortably..  7/30: Patient seen and examined.  Still endorsing chest pain.  Troponins continue to uptrend.  Last troponin greater than 15,000.  7/31: Patient seen and examined.  Troponin down trended to 6000 and then spiked back to 10,000.  Subsequently downtrending.  Chest pain improved.  EKG findings also improved.  Remains on heparin infusion.  Seen by cardiology.   Assessment & Plan:   Principal Problem:   Acute pericarditis Active Problems:   Sinus tachycardia   Hypokalemia   Troponin I above reference range  Suspected acute myopericarditis Initial EKG demonstrates mild diffusely elevated ST segment elevation Troponin initially 4000.  Was not checked until today.  Now up  trending to 11,000 and subsequently 15,000 Cardiology consulted, communicated with Dr. Gwen Pounds Echocardiogram with normal LV function Troponin peaked at 15,000, now downtrending Plan: Continue PCU status Continue to trend troponins every 4 hours Continue heparin GTT Ibuprofen 800 mg 3 times daily Colchicine 0.6 mg twice daily Cardiology following, possible repeat echo tomorrow Heart healthy diet  Elevated troponins Likely secondary to myopericarditis ACS felt unlikely with normal LV function on echo Continue to trend enzymes Heparin GTT   Hypokalemia Check daily BMP, replete as necessary   DVT prophylaxis: Heparin GTT Code Status: Full Family Communication: Mother at bedside Disposition Plan: Status is: Inpatient  Remains inpatient appropriate because:Inpatient level of care appropriate due to severity of illness   Dispo: The patient is from: Home              Anticipated d/c is to: Home              Anticipated d/c date is: 2 days              Patient currently is not medically stable to d/c.  Symptoms consistent with acute myopericarditis.  Will need to monitor for at least next 24 hours.   Consultants:   Cardiology-[facility]  Procedures:   2D echo  Antimicrobials:   None   Subjective: Patient seen and examined.  Complains of chest pain only when coughing.  Objective: Vitals:   07/14/20 1924 07/15/20 0550 07/15/20 0752 07/15/20 1106  BP: (!) 107/60 (!) 96/55 (!) 97/58 (!) 94/57  Pulse: 85 71 68 79  Resp:  20 20 19 18   Temp: 98.5 F (36.9 C) 97.9 F (36.6 C)  98.2 F (36.8 C)  TempSrc: Oral Oral  Oral  SpO2: 99% 98% 98% 97%  Weight:  86.1 kg    Height:        Intake/Output Summary (Last 24 hours) at 07/15/2020 1339 Last data filed at 07/15/2020 0700 Gross per 24 hour  Intake 2258.44 ml  Output 0 ml  Net 2258.44 ml   Filed Weights   07/13/20 1531 07/15/20 0550  Weight: 86.2 kg 86.1 kg    Examination:  General: No apparent distress,  patient appears well HEENT: Normocephalic, atraumatic Neck, supple, trachea midline, no tenderness Heart: Regular rate and rhythm, S1/S2 normal, no murmurs Lungs: Clear to auscultation bilaterally, no adventitious sounds, normal work of breathing Abdomen: Soft, nontender, nondistended, positive bowel sounds Extremities: Normal, atraumatic, no clubbing or cyanosis, normal muscle tone Skin: No rashes or lesions, normal color Neurologic: Cranial nerves grossly intact, sensation intact, alert and oriented x3 Psychiatric: Normal affect      Data Reviewed: I have personally reviewed following labs and imaging studies  CBC: Recent Labs  Lab 07/13/20 1544 07/14/20 0344 07/15/20 0435  WBC 9.5 8.0 6.9  NEUTROABS 6.1  --  3.0  HGB 16.8 15.3 13.1  HCT 49.6 45.4 37.3*  MCV 88.4 88.8 88.2  PLT 289 300 279   Basic Metabolic Panel: Recent Labs  Lab 07/13/20 1544 07/14/20 0344 07/15/20 0435  NA 134* 138 134*  K 3.2* 3.0* 3.8  CL 100 101 105  CO2 20* 27 22  GLUCOSE 122* 113* 94  BUN 13 10 13   CREATININE 1.03 0.92 0.89  CALCIUM 9.4 8.6* 8.3*  MG  --   --  2.1   GFR: Estimated Creatinine Clearance: 139.8 mL/min (by C-G formula based on SCr of 0.89 mg/dL). Liver Function Tests: Recent Labs  Lab 07/14/20 0344  AST 95*  ALT 31  ALKPHOS 47  BILITOT 0.7  PROT 7.6  ALBUMIN 3.8   No results for input(s): LIPASE, AMYLASE in the last 168 hours. No results for input(s): AMMONIA in the last 168 hours. Coagulation Profile: Recent Labs  Lab 07/14/20 0920  INR 1.0   Cardiac Enzymes: No results for input(s): CKTOTAL, CKMB, CKMBINDEX, TROPONINI in the last 168 hours. BNP (last 3 results) No results for input(s): PROBNP in the last 8760 hours. HbA1C: No results for input(s): HGBA1C in the last 72 hours. CBG: No results for input(s): GLUCAP in the last 168 hours. Lipid Profile: No results for input(s): CHOL, HDL, LDLCALC, TRIG, CHOLHDL, LDLDIRECT in the last 72 hours. Thyroid  Function Tests: No results for input(s): TSH, T4TOTAL, FREET4, T3FREE, THYROIDAB in the last 72 hours. Anemia Panel: No results for input(s): VITAMINB12, FOLATE, FERRITIN, TIBC, IRON, RETICCTPCT in the last 72 hours. Sepsis Labs: Recent Labs  Lab 07/13/20 1545  LATICACIDVEN 1.3    Recent Results (from the past 240 hour(s))  SARS Coronavirus 2 by RT PCR (hospital order, performed in [facility] hospital lab) Nasopharyngeal Nasopharyngeal Swab     Status: None   Collection Time: 07/13/20  3:44 PM   Specimen: Nasopharyngeal Swab  Result Value Ref Range Status   SARS Coronavirus 2 NEGATIVE NEGATIVE Final    Comment: (NOTE) SARS-CoV-2 target nucleic acids are NOT DETECTED.  The SARS-CoV-2 RNA is generally detectable in upper and lower respiratory specimens during the acute phase of infection. The lowest concentration of SARS-CoV-2 viral copies this assay can detect is 250 copies / mL.  A negative result does not preclude SARS-CoV-2 infection and should not be used as the sole basis for treatment or other patient management decisions.  A negative result may occur with improper specimen collection / handling, submission of specimen other than nasopharyngeal swab, presence of viral mutation(s) within the areas targeted by this assay, and inadequate number of viral copies (<250 copies / mL). A negative result must be combined with clinical observations, patient history, and epidemiological information.  Fact Sheet for Patients:   BoilerBrush.com.cy  Fact Sheet for Healthcare Providers: https://pope.com/  This test is not yet approved or  cleared by the [place] FDA and has been authorized for detection and/or diagnosis of SARS-CoV-2 by FDA under an Emergency Use Authorization (EUA).  This EUA will remain in effect (meaning this test can be used) for the duration of the COVID-19 declaration under Section 564(b)(1) of the Act, 21  U.S.C. section 360bbb-3(b)(1), unless the authorization is terminated or revoked sooner.  Performed at [facility], [address] [place], [place] [address]          Radiology Studies: DG Chest Portable 1 View  Result Date: 07/13/2020 CLINICAL DATA:  19 year old male with shortness of breath. EXAM: PORTABLE CHEST 1 VIEW COMPARISON:  None. FINDINGS: The heart size and mediastinal contours are within normal limits. Both lungs are clear. The visualized skeletal structures are unremarkable. IMPRESSION: No active disease. Electronically Signed   By: Elgie Collard M.D.   On: 07/13/2020 16:00   ECHOCARDIOGRAM COMPLETE  Result Date: 07/14/2020    ECHOCARDIOGRAM REPORT   Patient Name:   Adam Sparks Date of Exam: 07/14/2020 Medical Rec #:  [id number]    Height:       71.0 in Accession #:    [id number]   Weight:       190.0 lb Date of Birth:  10/11/2001     BSA:          2.063 m Patient Age:    19 years     BP:           138/90 mmHg Patient Gender: M            HR:           103 bpm. Exam Location:  [facility] Procedure: 2D Echo, Cardiac Doppler and Color Doppler Indications:     Pericardial effusion 423.9  History:         Patient has no prior history of Echocardiogram examinations. No                  medical history on file.  Sonographer:     Cristela Blue RDCS (AE) Referring Phys:  [id number] Rometta Emery Diagnosing Phys: Arnoldo Hooker MD IMPRESSIONS  1. Left ventricular ejection fraction, by estimation, is 45 to 50%. The left ventricle has mildly decreased function. The left ventricle has no regional wall motion abnormalities. Left ventricular diastolic parameters were normal.  2. Right ventricular systolic function is normal. The right ventricular size is normal. There is normal pulmonary artery systolic pressure.  3. The mitral valve is normal in structure. Trivial mitral valve regurgitation.  4. The aortic valve is normal in structure. Aortic valve regurgitation is trivial. FINDINGS  Left  Ventricle: Left ventricular ejection fraction, by estimation, is 45 to 50%. The left ventricle has mildly decreased function. The left ventricle has no regional wall motion abnormalities. The left ventricular internal cavity size was normal in size. There is no left ventricular hypertrophy. Left ventricular diastolic  parameters were normal. Right Ventricle: The right ventricular size is normal. No increase in right ventricular wall thickness. Right ventricular systolic function is normal. There is normal pulmonary artery systolic pressure. The tricuspid regurgitant velocity is 1.36 m/s, and  with an assumed right atrial pressure of 10 mmHg, the estimated right ventricular systolic pressure is 17.4 mmHg. Left Atrium: Left atrial size was normal in size. Right Atrium: Right atrial size was normal in size. Pericardium: There is no evidence of pericardial effusion. Mitral Valve: The mitral valve is normal in structure. Trivial mitral valve regurgitation. Tricuspid Valve: The tricuspid valve is normal in structure. Tricuspid valve regurgitation is trivial. Aortic Valve: The aortic valve is normal in structure. Aortic valve regurgitation is trivial. Aortic valve mean gradient measures 4.0 mmHg. Aortic valve peak gradient measures 6.6 mmHg. Aortic valve area, by VTI measures 1.98 cm. Pulmonic Valve: The pulmonic valve was normal in structure. Pulmonic valve regurgitation is not visualized. Aorta: The aortic root and ascending aorta are structurally normal, with no evidence of dilitation. IAS/Shunts: No atrial level shunt detected by color flow Doppler.  LEFT VENTRICLE PLAX 2D LVIDd:         4.64 cm  Diastology LVIDs:         3.36 cm  LV e' lateral:   12.10 cm/s LV PW:         0.90 cm  LV E/e' lateral: 7.2 LV IVS:        0.89 cm  LV e' medial:    12.70 cm/s LVOT diam:     2.00 cm  LV E/e' medial:  6.9 LV SV:         44 LV SV Index:   21 LVOT Area:     3.14 cm  RIGHT VENTRICLE RV S prime:     11.20 cm/s TAPSE (M-mode): 3.4  cm LEFT ATRIUM             Index       RIGHT ATRIUM           Index LA diam:        3.20 cm 1.55 cm/m  RA Area:     19.60 cm LA Vol (A2C):   47.1 ml 22.83 ml/m RA Volume:   58.90 ml  28.55 ml/m LA Vol (A4C):   38.1 ml 18.47 ml/m LA Biplane Vol: 45.4 ml 22.01 ml/m  AORTIC VALVE                   PULMONIC VALVE AV Area (Vmax):    2.09 cm    PV Vmax:        0.84 m/s AV Area (Vmean):   1.97 cm    PV Peak grad:   2.8 mmHg AV Area (VTI):     1.98 cm    RVOT Peak grad: 5 mmHg AV Vmax:           128.00 cm/s AV Vmean:          93.600 cm/s AV VTI:            0.224 m AV Peak Grad:      6.6 mmHg AV Mean Grad:      4.0 mmHg LVOT Vmax:         85.00 cm/s LVOT Vmean:        58.800 cm/s LVOT VTI:          0.141 m LVOT/AV VTI ratio: 0.63  AORTA Ao Root diam: 2.80 cm MITRAL VALVE  TRICUSPID VALVE MV Area (PHT): 3.34 cm    TR Peak grad:   7.4 mmHg MV Decel Time: 227 msec    TR Vmax:        136.00 cm/s MV E velocity: 87.30 cm/s MV A velocity: 31.00 cm/s  SHUNTS MV E/A ratio:  2.82        Systemic VTI:  0.14 m                            Systemic Diam: 2.00 cm Arnoldo HookerBruce Kowalski MD Electronically signed by Arnoldo HookerBruce Kowalski MD Signature Date/Time: 07/14/2020/12:19:42 PM    Final         Scheduled Meds: . buPROPion  150 mg Oral q morning - 10a  . colchicine  0.6 mg Oral BID  . ibuprofen  800 mg Oral TID  . traZODone  50 mg Oral QHS   Continuous Infusions: . sodium chloride 100 mL/hr at 07/15/20 0555  . heparin 1,100 Units/hr (07/15/20 0625)     LOS: 2 days    Time spent: 25 minutes    Tresa MooreSudheer B Cj Edgell, MD [facility] Pager [phone]  If 7PM-7AM, please contact night-coverage 07/15/2020, 1:39 PM

## 2020-07-15 NOTE — Progress Notes (Signed)
ANTICOAGULATION CONSULT NOTE  Pharmacy Consult for Heparin Indication: chest pain/ACS  No Known Allergies  Patient Measurements: Height: 5\' 11"  (180.3 cm) Weight: 86.1 kg (189 lb 12.8 oz) IBW/kg (Calculated) : 75.3 Heparin Dosing Weight: 86.2 kg   Vital Signs: Temp: 98.4 F (36.9 C) (07/31 1617) Temp Source: Oral (07/31 1617) BP: 114/69 (07/31 1617) Pulse Rate: 86 (07/31 1617)  Labs: Recent Labs    07/13/20 1544 07/13/20 1544 07/14/20 0344 07/14/20 0746 07/14/20 0920 07/14/20 1556 07/14/20 2233 07/15/20 0205 07/15/20 0435 07/15/20 0721 07/15/20 1009 07/15/20 1224 07/15/20 1749  HGB 16.8   < > 15.3  --   --   --   --   --  13.1  --   --   --   --   HCT 49.6  --  45.4  --   --   --   --   --  37.3*  --   --   --   --   PLT 289  --  300  --   --   --   --   --  279  --   --   --   --   APTT  --   --   --   --  35  --   --   --   --   --   --   --   --   LABPROT  --   --   --   --  13.1  --   --   --   --   --   --   --   --   INR  --   --   --   --  1.0  --   --   --   --   --   --   --   --   HEPARINUNFRC  --   --   --   --   --    < >   < >  --  0.10*  --   --  0.31 0.17*  CREATININE 1.03  --  0.92  --   --   --   --   --  0.89  --   --   --   --   TROPONINIHS 4,684*  --   --    < > 15,457*   < >  --  6,935*  --  10,454* 9,818*  --   --    < > = values in this interval not displayed.    Estimated Creatinine Clearance: 139.8 mL/min (by C-G formula based on SCr of 0.89 mg/dL).    Assessment: 19 yo male with elevated troponins and chest pain, starting on heparin drip. No oral anticoagulants on PTA med list. Last dose of SQ heparin 7/29 at 2359.  7/30 1556 HL 0.82, rate decreased to 850 units/hr 7/30 2233 HL 0.11, rate increased to 950 units/hr 7/31 0500 HL 0.10 , heparin 2400 units X 1 and rate increased to 1100 units/hr 7/31 1224 HL 0.31, rate continued at 1100 units/hr 7/31 1749 HL 0.17, heparin 2600 units IV X 1 and increase rate to 1400 units/hr    Goal  of Therapy:  Heparin level 0.3-0.7 units/ml Monitor platelets by anticoagulation protocol: Yes   Plan Will order heparin 2600 units IV X 1 and increase drip rate to 1400 units/hr.  Will recheck HL 6 hrs after rate change.   Prophet Renwick D Clinical Pharmacist 07/15/2020 6:49 PM

## 2020-07-15 NOTE — Progress Notes (Signed)
ANTICOAGULATION CONSULT NOTE  Pharmacy Consult for Heparin Indication: chest pain/ACS  No Known Allergies  Patient Measurements: Height: 5\' 11"  (180.3 cm) Weight: 86.1 kg (189 lb 12.8 oz) IBW/kg (Calculated) : 75.3 Heparin Dosing Weight: 86.2 kg   Vital Signs: Temp: 98.2 F (36.8 C) (07/31 1106) Temp Source: Oral (07/31 1106) BP: 94/57 (07/31 1106) Pulse Rate: 79 (07/31 1106)  Labs: Recent Labs    07/13/20 1544 07/13/20 1544 07/14/20 0344 07/14/20 0746 07/14/20 0920 07/14/20 1556 07/14/20 2233 07/14/20 2233 07/15/20 0205 07/15/20 0435 07/15/20 0721 07/15/20 1009 07/15/20 1224  HGB 16.8   < > 15.3  --   --   --   --   --   --  13.1  --   --   --   HCT 49.6  --  45.4  --   --   --   --   --   --  37.3*  --   --   --   PLT 289  --  300  --   --   --   --   --   --  279  --   --   --   APTT  --   --   --   --  35  --   --   --   --   --   --   --   --   LABPROT  --   --   --   --  13.1  --   --   --   --   --   --   --   --   INR  --   --   --   --  1.0  --   --   --   --   --   --   --   --   HEPARINUNFRC  --   --   --   --   --    < > 0.11*  --   --  0.10*  --   --  0.31  CREATININE 1.03  --  0.92  --   --   --   --   --   --  0.89  --   --   --   TROPONINIHS 4,684*  --   --    < > 15,457*   < > 6,403*   < > 6,935*  --  10,454* 9,818*  --    < > = values in this interval not displayed.    Estimated Creatinine Clearance: 139.8 mL/min (by C-G formula based on SCr of 0.89 mg/dL).    Assessment: 19 yo male with elevated troponins and chest pain, starting on heparin drip. No oral anticoagulants on PTA med list. Last dose of SQ heparin 7/29 at 2359.  7/30 1556 HL 0.82, rate decreased to 850 units/hr 7/31 1224 HL 0.31    Goal of Therapy:  Heparin level 0.3-0.7 units/ml Monitor platelets by anticoagulation protocol: Yes   Plan:  Heparin level is therapeutic. Will continue heparin infusion at 1100 units/hr. Will order anti-xa level in 6 hours. CBC daily while on  heparin.    8/31, PharmD, BCPS Clinical Pharmacist 07/15/2020 12:53 PM

## 2020-07-15 NOTE — Progress Notes (Addendum)
ANTICOAGULATION CONSULT NOTE - Initial Consult  Pharmacy Consult for Heparin Indication: chest pain/ACS  No Known Allergies  Patient Measurements: Height: 5\' 11"  (180.3 cm) Weight: 86.1 kg (189 lb 12.8 oz) IBW/kg (Calculated) : 75.3 Heparin Dosing Weight: 86.2 kg   Vital Signs: Temp: 97.9 F (36.6 C) (07/31 0550) Temp Source: Oral (07/31 0550) BP: 96/55 (07/31 0550) Pulse Rate: 71 (07/31 0550)  Labs: Recent Labs    07/13/20 1544 07/13/20 1544 07/14/20 0344 07/14/20 0746 07/14/20 0920 07/14/20 0920 07/14/20 1556 07/14/20 1758 07/14/20 2233 07/15/20 0205 07/15/20 0435  HGB 16.8   < > 15.3  --   --   --   --   --   --   --  13.1  HCT 49.6  --  45.4  --   --   --   --   --   --   --  37.3*  PLT 289  --  300  --   --   --   --   --   --   --  279  APTT  --   --   --   --  35  --   --   --   --   --   --   LABPROT  --   --   --   --  13.1  --   --   --   --   --   --   INR  --   --   --   --  1.0  --   --   --   --   --   --   HEPARINUNFRC  --   --   --   --   --   --  0.82*  --  0.11*  --  0.10*  CREATININE 1.03  --  0.92  --   --   --   --   --   --   --  0.89  TROPONINIHS 4,684*  --   --    < > 15,457*   < >  --  11,332* 6,403* 6,935*  --    < > = values in this interval not displayed.    Estimated Creatinine Clearance: 139.8 mL/min (by C-G formula based on SCr of 0.89 mg/dL).    Assessment: 19 yo male with elevated troponins and chest pain, starting on heparin drip. No oral anticoagulants on PTA med list. Last dose of SQ heparin 7/29 at 2359.  7/30 1556 HL 0.82, rate decreased to 850 units/hr  Goal of Therapy:  Heparin level 0.3-0.7 units/ml Monitor platelets by anticoagulation protocol: Yes   Plan:  07/31 @ 0500 HL 0.10 subtherapeutic. Will rebolus heparin 2400 units IV x 1 and increase rate to 1100 units/hr and will recheck HL at 1200, CBC trending down slightly will continue to monitor.  8/31, PharmD, BCPS Clinical Pharmacist 07/15/2020 6:22  AM

## 2020-07-15 NOTE — Progress Notes (Addendum)
Pt EKG resulted. Notify B Jon Billings made aware. Pt is resting at this time with no care of pain. Pt refused bed alarm at this time but was educated about safety. Will continue to monitor.

## 2020-07-15 NOTE — Progress Notes (Signed)
[facility] Encounter Note  Patient: Adam Sparks / Admit Date: 07/13/2020 / Date of Encounter: 07/15/2020, 6:37 AM   Subjective: Patient overall feels slightly better than admission with continued mild headache diffuse overall chest soreness and pain with no evidence of shortness of breath or congestive heart failure symptoms.  EKG has continued to evolve with diffuse ST changes throughout the entire EKG possibly consistent with diffuse injury Echocardiogram showing normal LV systolic function with ejection fraction of 50 percent and no evidence of valvular heart disease  Review of Systems: Positive for: Headache shortness of breath pain Negative for: Vision change, hearing change, syncope, dizziness, nausea, vomiting,diarrhea, bloody stool, stomach pain, cough, congestion, diaphoresis, urinary frequency, urinary pain,skin lesions, skin rashes Others previously listed  Objective: Telemetry: Normal sinus rhythm Physical Exam: Blood pressure (!) 96/55, pulse 71, temperature 97.9 F (36.6 C), temperature source Oral, resp. rate 20, height 5\' 11"  (1.803 m), weight 86.1 kg, SpO2 98 %. Body mass index is 26.47 kg/m. General: Well developed, well nourished, in no acute distress. Head: Normocephalic, atraumatic, sclera non-icteric, no xanthomas, nares are without discharge. Neck: No apparent masses Lungs: Normal respirations with no wheezes, no rhonchi, no rales , no crackles   Heart: Regular rate and rhythm, normal S1 S2, no murmur, no rub, no gallop, PMI is normal size and placement, carotid upstroke normal without bruit, jugular venous pressure normal Abdomen: Soft, non-tender, non-distended with normoactive bowel sounds. No hepatosplenomegaly. Abdominal aorta is normal size without bruit Extremities: No edema, no clubbing, no cyanosis, no ulcers,  Peripheral: 2+ radial, 2+ femoral, 2+ dorsal pedal pulses Neuro: Alert and oriented. Moves all extremities  spontaneously. Psych:  Responds to questions appropriately with a normal affect.   Intake/Output Summary (Last 24 hours) at 07/15/2020 07/17/2020 Last data filed at 07/15/2020 07/17/2020 Gross per 24 hour  Intake 2160.84 ml  Output --  Net 2160.84 ml    Inpatient Medications:  . buPROPion  150 mg Oral q morning - 10a  . clonazePAM  1 mg Oral QHS  . colchicine  0.6 mg Oral BID  . ibuprofen  800 mg Oral TID  . traZODone  50 mg Oral QHS   Infusions:  . sodium chloride 100 mL/hr at 07/15/20 0555  . heparin 1,100 Units/hr (07/15/20 0625)    Labs: Recent Labs    07/14/20 0344 07/15/20 0435  NA 138 134*  K 3.0* 3.8  CL 101 105  CO2 27 22  GLUCOSE 113* 94  BUN 10 13  CREATININE 0.92 0.89  CALCIUM 8.6* 8.3*  MG  --  2.1   Recent Labs    07/14/20 0344  AST 95*  ALT 31  ALKPHOS 47  BILITOT 0.7  PROT 7.6  ALBUMIN 3.8   Recent Labs    07/13/20 1544 07/13/20 1544 07/14/20 0344 07/15/20 0435  WBC 9.5   < > 8.0 6.9  NEUTROABS 6.1  --   --  3.0  HGB 16.8   < > 15.3 13.1  HCT 49.6   < > 45.4 37.3*  MCV 88.4   < > 88.8 88.2  PLT 289   < > 300 279   < > = values in this interval not displayed.   No results for input(s): CKTOTAL, CKMB, TROPONINI in the last 72 hours. Invalid input(s): POCBNP No results for input(s): HGBA1C in the last 72 hours.   Weights: Filed Weights   07/13/20 1531 07/15/20 0550  Weight: 86.2 kg 86.1 kg     Radiology/Studies:  DG Chest Portable 1 View  Result Date: 07/13/2020 CLINICAL DATA:  19 year old male with shortness of breath. EXAM: PORTABLE CHEST 1 VIEW COMPARISON:  None. FINDINGS: The heart size and mediastinal contours are within normal limits. Both lungs are clear. The visualized skeletal structures are unremarkable. IMPRESSION: No active disease. Electronically Signed   By: Elgie Collard M.D.   On: 07/13/2020 16:00   ECHOCARDIOGRAM COMPLETE  Result Date: 07/14/2020    ECHOCARDIOGRAM REPORT   Patient Name:   Adam Sparks Date of Exam:  07/14/2020 Medical Rec #:  [id number]    Height:       71.0 in Accession #:    [id number]   Weight:       190.0 lb Date of Birth:  04-16-2001     BSA:          2.063 m Patient Age:    19 years     BP:           138/90 mmHg Patient Gender: M            HR:           103 bpm. Exam Location:  [facility] Procedure: 2D Echo, Cardiac Doppler and Color Doppler Indications:     Pericardial effusion 423.9  History:         Patient has no prior history of Echocardiogram examinations. No                  medical history on file.  Sonographer:     Cristela Blue RDCS (AE) Referring Phys:  [id number] Rometta Emery Diagnosing Phys: Arnoldo Hooker MD IMPRESSIONS  1. Left ventricular ejection fraction, by estimation, is 45 to 50%. The left ventricle has mildly decreased function. The left ventricle has no regional wall motion abnormalities. Left ventricular diastolic parameters were normal.  2. Right ventricular systolic function is normal. The right ventricular size is normal. There is normal pulmonary artery systolic pressure.  3. The mitral valve is normal in structure. Trivial mitral valve regurgitation.  4. The aortic valve is normal in structure. Aortic valve regurgitation is trivial. FINDINGS  Left Ventricle: Left ventricular ejection fraction, by estimation, is 45 to 50%. The left ventricle has mildly decreased function. The left ventricle has no regional wall motion abnormalities. The left ventricular internal cavity size was normal in size. There is no left ventricular hypertrophy. Left ventricular diastolic parameters were normal. Right Ventricle: The right ventricular size is normal. No increase in right ventricular wall thickness. Right ventricular systolic function is normal. There is normal pulmonary artery systolic pressure. The tricuspid regurgitant velocity is 1.36 m/s, and  with an assumed right atrial pressure of 10 mmHg, the estimated right ventricular systolic pressure is 17.4 mmHg. Left Atrium: Left atrial size was normal in  size. Right Atrium: Right atrial size was normal in size. Pericardium: There is no evidence of pericardial effusion. Mitral Valve: The mitral valve is normal in structure. Trivial mitral valve regurgitation. Tricuspid Valve: The tricuspid valve is normal in structure. Tricuspid valve regurgitation is trivial. Aortic Valve: The aortic valve is normal in structure. Aortic valve regurgitation is trivial. Aortic valve mean gradient measures 4.0 mmHg. Aortic valve peak gradient measures 6.6 mmHg. Aortic valve area, by VTI measures 1.98 cm. Pulmonic Valve: The pulmonic valve was normal in structure. Pulmonic valve regurgitation is not visualized. Aorta: The aortic root and ascending aorta are structurally normal, with no evidence of dilitation. IAS/Shunts: No atrial level shunt detected by color flow Doppler.  LEFT VENTRICLE PLAX 2D LVIDd:         4.64 cm  Diastology LVIDs:         3.36 cm  LV e' lateral:   12.10 cm/s LV PW:         0.90 cm  LV E/e' lateral: 7.2 LV IVS:        0.89 cm  LV e' medial:    12.70 cm/s LVOT diam:     2.00 cm  LV E/e' medial:  6.9 LV SV:         44 LV SV Index:   21 LVOT Area:     3.14 cm  RIGHT VENTRICLE RV S prime:     11.20 cm/s TAPSE (M-mode): 3.4 cm LEFT ATRIUM             Index       RIGHT ATRIUM           Index LA diam:        3.20 cm 1.55 cm/m  RA Area:     19.60 cm LA Vol (A2C):   47.1 ml 22.83 ml/m RA Volume:   58.90 ml  28.55 ml/m LA Vol (A4C):   38.1 ml 18.47 ml/m LA Biplane Vol: 45.4 ml 22.01 ml/m  AORTIC VALVE                   PULMONIC VALVE AV Area (Vmax):    2.09 cm    PV Vmax:        0.84 m/s AV Area (Vmean):   1.97 cm    PV Peak grad:   2.8 mmHg AV Area (VTI):     1.98 cm    RVOT Peak grad: 5 mmHg AV Vmax:           128.00 cm/s AV Vmean:          93.600 cm/s AV VTI:            0.224 m AV Peak Grad:      6.6 mmHg AV Mean Grad:      4.0 mmHg LVOT Vmax:         85.00 cm/s LVOT Vmean:        58.800 cm/s LVOT VTI:          0.141 m LVOT/AV VTI ratio: 0.63  AORTA Ao Root  diam: 2.80 cm MITRAL VALVE               TRICUSPID VALVE MV Area (PHT): 3.34 cm    TR Peak grad:   7.4 mmHg MV Decel Time: 227 msec    TR Vmax:        136.00 cm/s MV E velocity: 87.30 cm/s MV A velocity: 31.00 cm/s  SHUNTS MV E/A ratio:  2.82        Systemic VTI:  0.14 m                            Systemic Diam: 2.00 cm Arnoldo HookerBruce Vergia Chea MD Electronically signed by Arnoldo HookerBruce Adalynd Donahoe MD Signature Date/Time: 07/14/2020/12:19:42 PM    Final      Assessment and Recommendation  19 y.o. 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  Okay to continue heparin for 24 more hours 2.  Will consider colchicine for  anti-inflammatory 3.  Will consider low-dose beta-blocker if patient can tolerate 4.  Cardiac MRI likely to divulge primary etiology of concern  Signed, Arnoldo Hooker M.D. FACC

## 2020-07-15 NOTE — Progress Notes (Signed)
CRITICAL VALUE ALERT  Critical Value:  Troponin 22633  Date & Time Notied:  07/15/20.1030  Provider Notified: Dr. Gwen Pounds  Orders Received/Actions taken: MD aware.

## 2020-07-16 ENCOUNTER — Inpatient Hospital Stay
Admit: 2020-07-16 | Discharge: 2020-07-16 | Disposition: A | Discharge status: Home / Self Care | Payer: Medicaid Other | Attending: Internal Medicine | Admitting: Internal Medicine

## 2020-07-16 LAB — HEPARIN LEVEL (UNFRACTIONATED)
Heparin Unfractionated: 0.41 IU/mL (ref 0.30–0.70)
Heparin Unfractionated: 0.47 IU/mL (ref 0.30–0.70)

## 2020-07-16 LAB — CBC WITH DIFFERENTIAL/PLATELET
Abs Immature Granulocytes: 0.05 10*3/uL (ref 0.00–0.07)
Basophils Absolute: 0.1 10*3/uL (ref 0.0–0.1)
Basophils Relative: 1 %
Eosinophils Absolute: 0.3 10*3/uL (ref 0.0–0.5)
Eosinophils Relative: 3 %
HCT: 35.2 % — ABNORMAL LOW (ref 39.0–52.0)
Hemoglobin: 12.3 g/dL — ABNORMAL LOW (ref 13.0–17.0)
Immature Granulocytes: 1 %
Lymphocytes Relative: 41 %
Lymphs Abs: 3.2 10*3/uL (ref 0.7–4.0)
MCH: 31.1 pg (ref 26.0–34.0)
MCHC: 34.9 g/dL (ref 30.0–36.0)
MCV: 88.9 fL (ref 80.0–100.0)
Monocytes Absolute: 0.7 10*3/uL (ref 0.1–1.0)
Monocytes Relative: 10 %
Neutro Abs: 3.5 10*3/uL (ref 1.7–7.7)
Neutrophils Relative %: 44 %
Platelets: 266 10*3/uL (ref 150–400)
RBC: 3.96 MIL/uL — ABNORMAL LOW (ref 4.22–5.81)
RDW: 12.5 % (ref 11.5–15.5)
WBC: 7.8 10*3/uL (ref 4.0–10.5)
nRBC: 0 % (ref 0.0–0.2)

## 2020-07-16 LAB — ECHOCARDIOGRAM LIMITED
Height: 71 in
Weight: 3036.8 oz

## 2020-07-16 LAB — BASIC METABOLIC PANEL
Anion gap: 4 — ABNORMAL LOW (ref 5–15)
BUN: 13 mg/dL (ref 6–20)
CO2: 24 mmol/L (ref 22–32)
Calcium: 8.5 mg/dL — ABNORMAL LOW (ref 8.9–10.3)
Chloride: 111 mmol/L (ref 98–111)
Creatinine, Ser: 0.92 mg/dL (ref 0.61–1.24)
GFR calc Af Amer: >60 mL/min (ref >60)
GFR calc non Af Amer: >60 mL/min (ref >60)
Glucose, Bld: 91 mg/dL (ref 70–99)
Potassium: 4.2 mmol/L (ref 3.5–5.1)
Sodium: 139 mmol/L (ref 135–145)

## 2020-07-16 LAB — MAGNESIUM: Magnesium: 2 mg/dL (ref 1.7–2.4)

## 2020-07-16 LAB — TROPONIN I (HIGH SENSITIVITY)
Troponin I (High Sensitivity): 6144 ng/L (ref <18)
Troponin I (High Sensitivity): 3089 ng/L (ref <18)

## 2020-07-16 MED ORDER — METOPROLOL TARTRATE 25 MG PO TABS
12.5000 mg | ORAL_TABLET | Freq: Two times a day (BID) | ORAL | Status: DC
  Administered 2020-07-16 – 2020-07-17 (×3): 12.5 mg via ORAL
  Filled 2020-07-16 (×3): qty 1

## 2020-07-16 NOTE — Progress Notes (Signed)
PROGRESS NOTE    Adam Sparks  TDD:[id number] DOB: March 24, 2001 DOA: 07/13/2020 PCP: System, Pcp Not In    Brief Narrative:  HPI: Adam Sparks is a 19 y.o. male with medical history significant of depression with anxiety who had acute viral illness staff syndromes in the last 3 days and not having generalized aches and fever.  Patient has been unable to sleep adequately.  He has problem with sleep and has been on trazodone.  He noted fever and chills.  Now he is having anterior chest pain especially with deep breath.  Also with mobility.  He is now having shortness of breath again.  Denied any IV drug abuse.  He has been vaccinated to Covid with the father vaccine.  Denied any prior cardiac history or any family history of cardiac disease.  Patient was in the ER evaluated and had markedly elevated troponin with ST elevation diffusely throughout the leads.  Cardiology consulted including STEMI team.  Suggestion is patient is having probably post viral pericarditis.  Recommended medical admission with evaluation including echocardiogram.  Cardiology to follow and make further recommendations.  Chest discomfort is more of pain now rated as 5 out of 10.  No radiation.  Worsened with deep breath but relieved by resting comfortably..  7/30: Patient seen and examined.  Still endorsing chest pain.  Troponins continue to uptrend.  Last troponin greater than 15,000.  7/31: Patient seen and examined.  Troponin down trended to 6000 and then spiked back to 10,000.  Subsequently downtrending.  Chest pain improved.  EKG findings also improved.  Remains on heparin infusion.  Seen by cardiology.  8/1: Patient seen and examined.  Troponin is now trending down to 3000.  Chest pain improved.  Patient continues to endorse mild soreness in the chest especially when coughing or sitting up but improved from prior.   Assessment & Plan:   Principal Problem:   Acute pericarditis Active Problems:   Sinus tachycardia    Hypokalemia   Troponin I above reference range  Suspected acute myopericarditis Initial EKG demonstrates mild diffusely elevated ST segment elevation Troponin initially 4000.  Was not checked until today.  Now up trending to 11,000 and subsequently 15,000 Cardiology consulted, communicated with Dr. Gwen Pounds Echocardiogram with normal LV function Troponin peaked at 15,000, now downtrending Latest down to 3000 Plan: Continue PCU status Continue to trend troponins every 4 hours DC heparin drip Ibuprofen 800 mg 3 times daily Colchicine 0.6 mg twice daily Cardiology following, repeat echo ordered and pending Heart healthy diet Per cardiology if echo reassuring can defer cardiac MRI to outpatient  Elevated troponins Likely secondary to myopericarditis ACS felt unlikely with normal LV function on echo Continue to trend enzymes Heparin GTT stopped after 48 hours   Hypokalemia Check daily BMP, replete as necessary   DVT prophylaxis: Heparin GTT Code Status: Full Family Communication: Mother at bedside Disposition Plan: Status is: Inpatient  Remains inpatient appropriate because:Inpatient level of care appropriate due to severity of illness   Dispo: The patient is from: Home              Anticipated d/c is to: Home              Anticipated d/c date is: 2 days              Patient currently is not medically stable to d/c.  Resolving acute myopericarditis.  Pending repeat echocardiogram.  Possible discharge within 24 hours.   Consultants:   Cardiology-[facility]  Procedures:  2D echo  Antimicrobials:   None   Subjective: Patient seen and examined.  Chest discomfort only when sitting up or coughing.  Objective: Vitals:   07/15/20 1926 07/16/20 0359 07/16/20 0726 07/16/20 1142  BP: (!) 104/61 (!) 100/56 (!) 95/57 (!) 103/58  Pulse: 85 69 63 70  Resp: 20 20 17 17   Temp: 98.4 F (36.9 C) 97.7 F (36.5 C) 97.6 F (36.4 C) 97.7 F (36.5 C)  TempSrc: Oral Oral  Oral   SpO2: 98% 96% 97% 97%  Weight:      Height:        Intake/Output Summary (Last 24 hours) at 07/16/2020 1444 Last data filed at 07/16/2020 1142 Gross per 24 hour  Intake 2089.4 ml  Output 100 ml  Net 1989.4 ml   Filed Weights   07/13/20 1531 07/15/20 0550  Weight: 86.2 kg 86.1 kg    Examination:  General: No apparent distress, patient appears well HEENT: Normocephalic, atraumatic Neck, supple, trachea midline, no tenderness Heart: Regular rate and rhythm, S1/S2 normal, no murmurs Lungs: Clear to auscultation bilaterally, no adventitious sounds, normal work of breathing Abdomen: Soft, nontender, nondistended, positive bowel sounds Extremities: Normal, atraumatic, no clubbing or cyanosis, normal muscle tone Skin: No rashes or lesions, normal color Neurologic: Cranial nerves grossly intact, sensation intact, alert and oriented x3 Psychiatric: Normal affect     Data Reviewed: I have personally reviewed following labs and imaging studies  CBC: Recent Labs  Lab 07/13/20 1544 07/14/20 0344 07/15/20 0435 07/16/20 0509  WBC 9.5 8.0 6.9 7.8  NEUTROABS 6.1  --  3.0 3.5  HGB 16.8 15.3 13.1 12.3*  HCT 49.6 45.4 37.3* 35.2*  MCV 88.4 88.8 88.2 88.9  PLT 289 300 279 266   Basic Metabolic Panel: Recent Labs  Lab 07/13/20 1544 07/14/20 0344 07/15/20 0435 07/16/20 0509  NA 134* 138 134* 139  K 3.2* 3.0* 3.8 4.2  CL 100 101 105 111  CO2 20* 27 22 24   GLUCOSE 122* 113* 94 91  BUN 13 10 13 13   CREATININE 1.03 0.92 0.89 0.92  CALCIUM 9.4 8.6* 8.3* 8.5*  MG  --   --  2.1 2.0   GFR: Estimated Creatinine Clearance: 135.3 mL/min (by C-G formula based on SCr of 0.92 mg/dL). Liver Function Tests: Recent Labs  Lab 07/14/20 0344  AST 95*  ALT 31  ALKPHOS 47  BILITOT 0.7  PROT 7.6  ALBUMIN 3.8   No results for input(s): LIPASE, AMYLASE in the last 168 hours. No results for input(s): AMMONIA in the last 168 hours. Coagulation Profile: Recent Labs  Lab  07/14/20 0920  INR 1.0   Cardiac Enzymes: No results for input(s): CKTOTAL, CKMB, CKMBINDEX, TROPONINI in the last 168 hours. BNP (last 3 results) No results for input(s): PROBNP in the last 8760 hours. HbA1C: No results for input(s): HGBA1C in the last 72 hours. CBG: No results for input(s): GLUCAP in the last 168 hours. Lipid Profile: No results for input(s): CHOL, HDL, LDLCALC, TRIG, CHOLHDL, LDLDIRECT in the last 72 hours. Thyroid Function Tests: No results for input(s): TSH, T4TOTAL, FREET4, T3FREE, THYROIDAB in the last 72 hours. Anemia Panel: No results for input(s): VITAMINB12, FOLATE, FERRITIN, TIBC, IRON, RETICCTPCT in the last 72 hours. Sepsis Labs: Recent Labs  Lab 07/13/20 1545  LATICACIDVEN 1.3    Recent Results (from the past 240 hour(s))  SARS Coronavirus 2 by RT PCR (hospital order, performed in [facility] hospital lab) Nasopharyngeal Nasopharyngeal Swab     Status: None  Collection Time: 07/13/20  3:44 PM   Specimen: Nasopharyngeal Swab  Result Value Ref Range Status   SARS Coronavirus 2 NEGATIVE NEGATIVE Final    Comment: (NOTE) SARS-CoV-2 target nucleic acids are NOT DETECTED.  The SARS-CoV-2 RNA is generally detectable in upper and lower respiratory specimens during the acute phase of infection. The lowest concentration of SARS-CoV-2 viral copies this assay can detect is 250 copies / mL. A negative result does not preclude SARS-CoV-2 infection and should not be used as the sole basis for treatment or other patient management decisions.  A negative result may occur with improper specimen collection / handling, submission of specimen other than nasopharyngeal swab, presence of viral mutation(s) within the areas targeted by this assay, and inadequate number of viral copies (<250 copies / mL). A negative result must be combined with clinical observations, patient history, and epidemiological information.  Fact Sheet for Patients:    BoilerBrush.com.cy  Fact Sheet for Healthcare Providers: https://pope.com/  This test is not yet approved or  cleared by the [place] FDA and has been authorized for detection and/or diagnosis of SARS-CoV-2 by FDA under an Emergency Use Authorization (EUA).  This EUA will remain in effect (meaning this test can be used) for the duration of the COVID-19 declaration under Section 564(b)(1) of the Act, 21 U.S.C. section 360bbb-3(b)(1), unless the authorization is terminated or revoked sooner.  Performed at [facility], [address] [place], [place] [address]          Radiology Studies: No results found.      Scheduled Meds: . buPROPion  150 mg Oral q morning - 10a  . colchicine  0.6 mg Oral BID  . ibuprofen  800 mg Oral TID  . metoprolol tartrate  12.5 mg Oral BID  . traZODone  50 mg Oral QHS   Continuous Infusions:    LOS: 3 days    Time spent: 25 minutes    Tresa Moore, MD [facility] Pager [phone]  If 7PM-7AM, please contact night-coverage 07/16/2020, 2:44 PM

## 2020-07-16 NOTE — Plan of Care (Signed)
  Problem: Health Behavior/Discharge Planning: Goal: Ability to manage health-related needs will improve Outcome: Progressing   Problem: Education: Goal: Knowledge of General Education information will improve Description: Including pain rating scale, medication(s)/side effects and non-pharmacologic comfort measures Outcome: Progressing   Problem: Health Behavior/Discharge Planning: Goal: Ability to manage health-related needs will improve Outcome: Progressing   

## 2020-07-16 NOTE — Progress Notes (Signed)
ANTICOAGULATION CONSULT NOTE  Pharmacy Consult for Heparin Indication: chest pain/ACS  No Known Allergies  Patient Measurements: Height: 5\' 11"  (180.3 cm) Weight: 86.1 kg (189 lb 12.8 oz) IBW/kg (Calculated) : 75.3 Heparin Dosing Weight: 86.2 kg   Vital Signs: Temp: 97.6 F (36.4 C) (08/01 0726) Temp Source: Oral (08/01 0726) BP: 95/57 (08/01 0726) Pulse Rate: 63 (08/01 0726)  Labs: Recent Labs    07/14/20 0344 07/14/20 0746 07/14/20 0920 07/14/20 1556 07/14/20 2233 07/15/20 0205 07/15/20 0435 07/15/20 0721 07/15/20 1009 07/15/20 1224 07/15/20 1749 07/16/20 0102 07/16/20 0509 07/16/20 0708  HGB 15.3  --   --   --   --   --  13.1  --   --   --   --   --  12.3*  --   HCT 45.4  --   --   --   --   --  37.3*  --   --   --   --   --  35.2*  --   PLT 300  --   --   --   --   --  279  --   --   --   --   --  266  --   APTT  --   --  35  --   --   --   --   --   --   --   --   --   --   --   LABPROT  --   --  13.1  --   --   --   --   --   --   --   --   --   --   --   INR  --   --  1.0  --   --   --   --   --   --   --   --   --   --   --   HEPARINUNFRC  --   --   --    < >   < >  --  0.10*  --   --    < > 0.17* 0.41  --  0.47  CREATININE 0.92  --   --   --   --   --  0.89  --   --   --   --   --  0.92  --   TROPONINIHS  --    < > 15,457*   < >  --  6,935*  --  10,454* 9,818*  --   --   --   --   --    < > = values in this interval not displayed.    Estimated Creatinine Clearance: 135.3 mL/min (by C-G formula based on SCr of 0.92 mg/dL).    Assessment: 19 yo male with elevated troponins and chest pain, starting on heparin drip. No oral anticoagulants on PTA med list. Last dose of SQ heparin 7/29 at 2359.   7/31 1749 HL 0.17, heparin 2600 units IV X 1 and increase rate to 1400 units/hr  8/1 0102 HL 0.41  8/1 0708 HL 0.47   Goal of Therapy:  Heparin level 0.3-0.7 units/ml Monitor platelets by anticoagulation protocol: Yes   Plan Heparin level is therapeutic.  Will continue rate at 1400 units/hr and will recheck HL and CBC with AM labs.    8/31, PharmD, BCPS Clinical Pharmacist 07/16/2020 8:39 AM

## 2020-07-16 NOTE — Progress Notes (Signed)
[facility] Cardiology [facility] [place] Encounter Note  Patient: Adam Sparks / Admit Date: 07/13/2020 / Date of Encounter: 07/16/2020, 6:31 AM   Subjective: Patient overall feels slightly better than admission with continued mild headache diffuse overall chest soreness and pain with no evidence of shortness of breath or congestive heart failure symptoms.  Troponin levels have started to trend downward at this time.  EKG has continued to evolve with diffuse ST changes throughout the entire EKG possibly consistent with diffuse injury Echocardiogram showing normal LV systolic function with ejection fraction of 50 percent and no evidence of valvular heart disease  Review of Systems: Positive for: Headache shortness of breath pain somewhat improved Negative for: Vision change, hearing change, syncope, dizziness, nausea, vomiting,diarrhea, bloody stool, stomach pain, cough, congestion, diaphoresis, urinary frequency, urinary pain,skin lesions, skin rashes Others previously listed  Objective: Telemetry: Normal sinus rhythm Physical Exam: Blood pressure (!) 100/56, pulse 69, temperature 97.7 F (36.5 C), temperature source Oral, resp. rate 20, height 5\' 11"  (1.803 m), weight 86.1 kg, SpO2 96 %. Body mass index is 26.47 kg/m. General: Well developed, well nourished, in no acute distress. Head: Normocephalic, atraumatic, sclera non-icteric, no xanthomas, nares are without discharge. Neck: No apparent masses Lungs: Normal respirations with no wheezes, no rhonchi, no rales , no crackles   Heart: Regular rate and rhythm, normal S1 S2, no murmur, no rub, no gallop, PMI is normal size and placement, carotid upstroke normal without bruit, jugular venous pressure normal Abdomen: Soft, non-tender, non-distended with normoactive bowel sounds. No hepatosplenomegaly. Abdominal aorta is normal size without bruit Extremities: No edema, no clubbing, no cyanosis, no ulcers,  Peripheral: 2+ radial, 2+ femoral, 2+ dorsal  pedal pulses Neuro: Alert and oriented. Moves all extremities spontaneously. Psych:  Responds to questions appropriately with a normal affect.   Intake/Output Summary (Last 24 hours) at 07/16/2020 0631 Last data filed at 07/16/2020 0334 Gross per 24 hour  Intake 1947 ml  Output 100 ml  Net 1847 ml    Inpatient Medications:   buPROPion  150 mg Oral q morning - 10a   colchicine  0.6 mg Oral BID   ibuprofen  800 mg Oral TID   traZODone  50 mg Oral QHS   Infusions:   sodium chloride 100 mL/hr at 07/16/20 0104   heparin 1,400 Units/hr (07/15/20 2207)    Labs: Recent Labs    07/15/20 0435 07/16/20 0509  NA 134* 139  K 3.8 4.2  CL 105 111  CO2 22 24  GLUCOSE 94 91  BUN 13 13  CREATININE 0.89 0.92  CALCIUM 8.3* 8.5*  MG 2.1 2.0   Recent Labs    07/14/20 0344  AST 95*  ALT 31  ALKPHOS 47  BILITOT 0.7  PROT 7.6  ALBUMIN 3.8   Recent Labs    07/15/20 0435 07/16/20 0509  WBC 6.9 7.8  NEUTROABS 3.0 3.5  HGB 13.1 12.3*  HCT 37.3* 35.2*  MCV 88.2 88.9  PLT 279 266   No results for input(s): CKTOTAL, CKMB, TROPONINI in the last 72 hours. Invalid input(s): POCBNP No results for input(s): HGBA1C in the last 72 hours.   Weights: Filed Weights   07/13/20 1531 07/15/20 0550  Weight: 86.2 kg 86.1 kg     Radiology/Studies:  DG Chest Portable 1 View  Result Date: 07/13/2020 CLINICAL DATA:  19 year old male with shortness of breath. EXAM: PORTABLE CHEST 1 VIEW COMPARISON:  None. FINDINGS: The heart size and mediastinal contours are within normal limits. Both lungs are clear.  The visualized skeletal structures are unremarkable. IMPRESSION: No active disease. Electronically Signed   By: Elgie Collard M.D.   On: 07/13/2020 16:00   ECHOCARDIOGRAM COMPLETE  Result Date: 07/14/2020    ECHOCARDIOGRAM REPORT   Patient Name:   Adam Sparks Date of Exam: 07/14/2020 Medical Rec #:  [id number]    Height:       71.0 in Accession #:    [id number]   Weight:       190.0 lb  Date of Birth:  24-Sep-2001     BSA:          2.063 m Patient Age:    19 years     BP:           138/90 mmHg Patient Gender: M            HR:           103 bpm. Exam Location:  [facility] Procedure: 2D Echo, Cardiac Doppler and Color Doppler Indications:     Pericardial effusion 423.9  History:         Patient has no prior history of Echocardiogram examinations. No                  medical history on file.  Sonographer:     Cristela Blue RDCS (AE) Referring Phys:  [id number] Rometta Emery Diagnosing Phys: Arnoldo Hooker MD IMPRESSIONS  1. Left ventricular ejection fraction, by estimation, is 45 to 50%. The left ventricle has mildly decreased function. The left ventricle has no regional wall motion abnormalities. Left ventricular diastolic parameters were normal.  2. Right ventricular systolic function is normal. The right ventricular size is normal. There is normal pulmonary artery systolic pressure.  3. The mitral valve is normal in structure. Trivial mitral valve regurgitation.  4. The aortic valve is normal in structure. Aortic valve regurgitation is trivial. FINDINGS  Left Ventricle: Left ventricular ejection fraction, by estimation, is 45 to 50%. The left ventricle has mildly decreased function. The left ventricle has no regional wall motion abnormalities. The left ventricular internal cavity size was normal in size. There is no left ventricular hypertrophy. Left ventricular diastolic parameters were normal. Right Ventricle: The right ventricular size is normal. No increase in right ventricular wall thickness. Right ventricular systolic function is normal. There is normal pulmonary artery systolic pressure. The tricuspid regurgitant velocity is 1.36 m/s, and  with an assumed right atrial pressure of 10 mmHg, the estimated right ventricular systolic pressure is 17.4 mmHg. Left Atrium: Left atrial size was normal in size. Right Atrium: Right atrial size was normal in size. Pericardium: There is no evidence of pericardial  effusion. Mitral Valve: The mitral valve is normal in structure. Trivial mitral valve regurgitation. Tricuspid Valve: The tricuspid valve is normal in structure. Tricuspid valve regurgitation is trivial. Aortic Valve: The aortic valve is normal in structure. Aortic valve regurgitation is trivial. Aortic valve mean gradient measures 4.0 mmHg. Aortic valve peak gradient measures 6.6 mmHg. Aortic valve area, by VTI measures 1.98 cm. Pulmonic Valve: The pulmonic valve was normal in structure. Pulmonic valve regurgitation is not visualized. Aorta: The aortic root and ascending aorta are structurally normal, with no evidence of dilitation. IAS/Shunts: No atrial level shunt detected by color flow Doppler.  LEFT VENTRICLE PLAX 2D LVIDd:         4.64 cm  Diastology LVIDs:         3.36 cm  LV e' lateral:   12.10 cm/s LV PW:  0.90 cm  LV E/e' lateral: 7.2 LV IVS:        0.89 cm  LV e' medial:    12.70 cm/s LVOT diam:     2.00 cm  LV E/e' medial:  6.9 LV SV:         44 LV SV Index:   21 LVOT Area:     3.14 cm  RIGHT VENTRICLE RV S prime:     11.20 cm/s TAPSE (M-mode): 3.4 cm LEFT ATRIUM             Index       RIGHT ATRIUM           Index LA diam:        3.20 cm 1.55 cm/m  RA Area:     19.60 cm LA Vol (A2C):   47.1 ml 22.83 ml/m RA Volume:   58.90 ml  28.55 ml/m LA Vol (A4C):   38.1 ml 18.47 ml/m LA Biplane Vol: 45.4 ml 22.01 ml/m  AORTIC VALVE                   PULMONIC VALVE AV Area (Vmax):    2.09 cm    PV Vmax:        0.84 m/s AV Area (Vmean):   1.97 cm    PV Peak grad:   2.8 mmHg AV Area (VTI):     1.98 cm    RVOT Peak grad: 5 mmHg AV Vmax:           128.00 cm/s AV Vmean:          93.600 cm/s AV VTI:            0.224 m AV Peak Grad:      6.6 mmHg AV Mean Grad:      4.0 mmHg LVOT Vmax:         85.00 cm/s LVOT Vmean:        58.800 cm/s LVOT VTI:          0.141 m LVOT/AV VTI ratio: 0.63  AORTA Ao Root diam: 2.80 cm MITRAL VALVE               TRICUSPID VALVE MV Area (PHT): 3.34 cm    TR Peak grad:   7.4 mmHg  MV Decel Time: 227 msec    TR Vmax:        136.00 cm/s MV E velocity: 87.30 cm/s MV A velocity: 31.00 cm/s  SHUNTS MV E/A ratio:  2.82        Systemic VTI:  0.14 m                            Systemic Diam: 2.00 cm Arnoldo HookerBruce Lindzee Gouge MD Electronically signed by Arnoldo HookerBruce Adriyana Greenbaum MD Signature Date/Time: 07/14/2020/12:19:42 PM    Final      Assessment and Recommendation  19 y.o. 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  Repeat echocardiogram today for further evaluation of LV systolic dysfunction and risk stratification of probable myocarditis 2.  Continuation of colchicine at this time for anti-inflammatory 3.  Will consider the possibility of very low-dose beta-blocker if patient can tolerate 4.  Cardiac MRI likely to divulge primary etiology of concern although if echocardiogram normal with no change can potentially  perform cardiac MRI as an outpatient  Signed, Arnoldo Hooker M.D. FACC

## 2020-07-16 NOTE — Progress Notes (Signed)
ANTICOAGULATION CONSULT NOTE  Pharmacy Consult for Heparin Indication: chest pain/ACS  No Known Allergies  Patient Measurements: Height: 5\' 11"  (180.3 cm) Weight: 86.1 kg (189 lb 12.8 oz) IBW/kg (Calculated) : 75.3 Heparin Dosing Weight: 86.2 kg   Vital Signs: Temp: 98.4 F (36.9 C) (07/31 1926) Temp Source: Oral (07/31 1926) BP: 104/61 (07/31 1926) Pulse Rate: 85 (07/31 1926)  Labs: Recent Labs    07/13/20 1544 07/13/20 1544 07/14/20 0344 07/14/20 0746 07/14/20 0920 07/14/20 1556 07/14/20 2233 07/15/20 0205 07/15/20 0435 07/15/20 0435 07/15/20 0721 07/15/20 1009 07/15/20 1224 07/15/20 1749 07/16/20 0102  HGB 16.8   < > 15.3  --   --   --   --   --  13.1  --   --   --   --   --   --   HCT 49.6  --  45.4  --   --   --   --   --  37.3*  --   --   --   --   --   --   PLT 289  --  300  --   --   --   --   --  279  --   --   --   --   --   --   APTT  --   --   --   --  35  --   --   --   --   --   --   --   --   --   --   LABPROT  --   --   --   --  13.1  --   --   --   --   --   --   --   --   --   --   INR  --   --   --   --  1.0  --   --   --   --   --   --   --   --   --   --   HEPARINUNFRC  --   --   --   --   --    < >   < >  --  0.10*   < >  --   --  0.31 0.17* 0.41  CREATININE 1.03  --  0.92  --   --   --   --   --  0.89  --   --   --   --   --   --   TROPONINIHS 4,684*  --   --    < > 15,457*   < >  --  6,935*  --   --  10,454* 9,818*  --   --   --    < > = values in this interval not displayed.    Estimated Creatinine Clearance: 139.8 mL/min (by C-G formula based on SCr of 0.89 mg/dL).    Assessment: 19 yo male with elevated troponins and chest pain, starting on heparin drip. No oral anticoagulants on PTA med list. Last dose of SQ heparin 7/29 at 2359.  7/30 1556 HL 0.82, rate decreased to 850 units/hr 7/30 2233 HL 0.11, rate increased to 950 units/hr 7/31 0500 HL 0.10 , heparin 2400 units X 1 and rate increased to 1100 units/hr 7/31 1224 HL 0.31, rate  continued at 1100 units/hr 7/31 1749 HL 0.17, heparin 2600 units IV X 1 and increase rate  to 1400 units/hr    Goal of Therapy:  Heparin level 0.3-0.7 units/ml Monitor platelets by anticoagulation protocol: Yes   Plan 08/01 @ 0100 HL 0.41 therapeutic. Will continue rate at 1400 units/hr and will recheck HL at 0700, CBC grossly will continue to monitor.  Thomasene Ripple, PharmD, BCPS Clinical Pharmacist 07/16/2020 2:22 AM

## 2020-07-16 NOTE — Plan of Care (Signed)
  Problem: Pain Managment: Goal: General experience of comfort will improve Outcome: Progressing   Problem: Safety: Goal: Ability to remain free from injury will improve Outcome: Progressing   Problem: Cardiac: Goal: Ability to achieve and maintain adequate cardiovascular perfusion will improve Outcome: Progressing   

## 2020-07-16 NOTE — Progress Notes (Signed)
Pt refused bed alarm but was educated bout safety. Will continue to monitor.

## 2020-07-17 LAB — CBC WITH DIFFERENTIAL/PLATELET
Abs Immature Granulocytes: 0.08 10*3/uL — ABNORMAL HIGH (ref 0.00–0.07)
Basophils Absolute: 0.1 10*3/uL (ref 0.0–0.1)
Basophils Relative: 1 %
Eosinophils Absolute: 0.3 10*3/uL (ref 0.0–0.5)
Eosinophils Relative: 3 %
HCT: 35.9 % — ABNORMAL LOW (ref 39.0–52.0)
Hemoglobin: 12.5 g/dL — ABNORMAL LOW (ref 13.0–17.0)
Immature Granulocytes: 1 %
Lymphocytes Relative: 35 %
Lymphs Abs: 2.9 10*3/uL (ref 0.7–4.0)
MCH: 30.7 pg (ref 26.0–34.0)
MCHC: 34.8 g/dL (ref 30.0–36.0)
MCV: 88.2 fL (ref 80.0–100.0)
Monocytes Absolute: 0.6 10*3/uL (ref 0.1–1.0)
Monocytes Relative: 7 %
Neutro Abs: 4.4 10*3/uL (ref 1.7–7.7)
Neutrophils Relative %: 53 %
Platelets: 324 10*3/uL (ref 150–400)
RBC: 4.07 MIL/uL — ABNORMAL LOW (ref 4.22–5.81)
RDW: 12.2 % (ref 11.5–15.5)
Smear Review: NORMAL
WBC: 8.1 10*3/uL (ref 4.0–10.5)
nRBC: 0 % (ref 0.0–0.2)

## 2020-07-17 LAB — BASIC METABOLIC PANEL
Anion gap: 6 (ref 5–15)
BUN: 12 mg/dL (ref 6–20)
CO2: 25 mmol/L (ref 22–32)
Calcium: 8.3 mg/dL — ABNORMAL LOW (ref 8.9–10.3)
Chloride: 108 mmol/L (ref 98–111)
Creatinine, Ser: 0.78 mg/dL (ref 0.61–1.24)
GFR calc Af Amer: >60 mL/min (ref >60)
GFR calc non Af Amer: >60 mL/min (ref >60)
Glucose, Bld: 83 mg/dL (ref 70–99)
Potassium: 3.5 mmol/L (ref 3.5–5.1)
Sodium: 139 mmol/L (ref 135–145)

## 2020-07-17 LAB — MAGNESIUM: Magnesium: 2 mg/dL (ref 1.7–2.4)

## 2020-07-17 MED ORDER — COLCHICINE 0.6 MG PO TABS: 0.6000 mg | ORAL_TABLET | Freq: Two times a day (BID) | ORAL | 1 refills | Status: AC

## 2020-07-17 MED ORDER — METOPROLOL TARTRATE 25 MG PO TABS: 12.5000 mg | ORAL_TABLET | Freq: Two times a day (BID) | ORAL | 0 refills | Status: AC

## 2020-07-17 MED ORDER — IBUPROFEN 800 MG PO TABS: 800.0000 mg | ORAL_TABLET | Freq: Three times a day (TID) | ORAL | 0 refills | Status: AC

## 2020-07-17 NOTE — Plan of Care (Signed)
  Problem: Education: Goal: Knowledge of General Education information will improve Description: Including pain rating scale, medication(s)/side effects and non-pharmacologic comfort measures Outcome: Progressing   Problem: Clinical Measurements: Goal: Diagnostic test results will improve Outcome: Progressing   Problem: Pain Managment: Goal: General experience of comfort will improve Outcome: Progressing   Problem: Activity: Goal: Ability to tolerate increased activity will improve Outcome: Progressing

## 2020-07-17 NOTE — Progress Notes (Signed)
[facility] Cardiology [facility] Encounter Note  Patient: Adam Sparks / Admit Date: 07/13/2020 / Date of Encounter: 07/17/2020, 12:40 PM   Subjective: Patient overall feels slightly better than admission with continued mild headache diffuse overall chest soreness and pain with no evidence of shortness of breath or congestive heart failure symptoms.  Troponin levels have started to trend downward at this time.  EKG has continued to evolve with diffuse ST changes throughout the entire EKG possibly consistent with diffuse injury Echocardiogram showing normal LV systolic function with ejection fraction of 50 percent and no evidence of valvular heart disease This has been evaluated twice during his hospitalization with no change Review of Systems: Positive for: None Negative for: Vision change, hearing change, syncope, dizziness, nausea, vomiting,diarrhea, bloody stool, stomach pain, cough, congestion, diaphoresis, urinary frequency, urinary pain,skin lesions, skin rashes Others previously listed  Objective: Telemetry: Normal sinus rhythm Physical Exam: Blood pressure 103/61, pulse 63, temperature 98.4 F (36.9 C), temperature source Oral, resp. rate 20, height 5\' 11"  (1.803 m), weight 86.1 kg, SpO2 96 %. Body mass index is 26.47 kg/m. General: Well developed, well nourished, in no acute distress. Head: Normocephalic, atraumatic, sclera non-icteric, no xanthomas, nares are without discharge. Neck: No apparent masses Lungs: Normal respirations with no wheezes, no rhonchi, no rales , no crackles   Heart: Regular rate and rhythm, normal S1 S2, no murmur, no rub, no gallop, PMI is normal size and placement, carotid upstroke normal without bruit, jugular venous pressure normal Abdomen: Soft, non-tender, non-distended with normoactive bowel sounds. No hepatosplenomegaly. Abdominal aorta is normal size without bruit Extremities: No edema, no clubbing, no cyanosis, no ulcers,  Peripheral: 2+ radial, 2+  femoral, 2+ dorsal pedal pulses Neuro: Alert and oriented. Moves all extremities spontaneously. Psych:  Responds to questions appropriately with a normal affect.   Intake/Output Summary (Last 24 hours) at 07/17/2020 1240 Last data filed at 07/17/2020 0915 Gross per 24 hour  Intake 840 ml  Output --  Net 840 ml    Inpatient Medications:  . buPROPion  150 mg Oral q morning - 10a  . colchicine  0.6 mg Oral BID  . ibuprofen  800 mg Oral TID  . metoprolol tartrate  12.5 mg Oral BID  . traZODone  50 mg Oral QHS   Infusions:    Labs: Recent Labs    07/16/20 0509 07/17/20 0525  NA 139 139  K 4.2 3.5  CL 111 108  CO2 24 25  GLUCOSE 91 83  BUN 13 12  CREATININE 0.92 0.78  CALCIUM 8.5* 8.3*  MG 2.0 2.0   No results for input(s): AST, ALT, ALKPHOS, BILITOT, PROT, ALBUMIN in the last 72 hours. Recent Labs    07/16/20 0509 07/17/20 0525  WBC 7.8 8.1  NEUTROABS 3.5 4.4  HGB 12.3* 12.5*  HCT 35.2* 35.9*  MCV 88.9 88.2  PLT 266 324   No results for input(s): CKTOTAL, CKMB, TROPONINI in the last 72 hours. Invalid input(s): POCBNP No results for input(s): HGBA1C in the last 72 hours.   Weights: Filed Weights   07/13/20 1531 07/15/20 0550  Weight: 86.2 kg 86.1 kg     Radiology/Studies:  DG Chest Portable 1 View  Result Date: 07/13/2020 CLINICAL DATA:  19 year old male with shortness of breath. EXAM: PORTABLE CHEST 1 VIEW COMPARISON:  None. FINDINGS: The heart size and mediastinal contours are within normal limits. Both lungs are clear. The visualized skeletal structures are unremarkable. IMPRESSION: No active disease. Electronically Signed   By: 36  Radparvar M.D.   On: 07/13/2020 16:00   ECHOCARDIOGRAM COMPLETE  Result Date: 07/14/2020    ECHOCARDIOGRAM REPORT   Patient Name:   Adam Sparks Date of Exam: 07/14/2020 Medical Rec #:  [id number]    Height:       71.0 in Accession #:    [id number]   Weight:       190.0 lb Date of Birth:  2001-10-29     BSA:          2.063 m  Patient Age:    19 years     BP:           138/90 mmHg Patient Gender: M            HR:           103 bpm. Exam Location:  [facility] Procedure: 2D Echo, Cardiac Doppler and Color Doppler Indications:     Pericardial effusion 423.9  History:         Patient has no prior history of Echocardiogram examinations. No                  medical history on file.  Sonographer:     Cristela Blue RDCS (AE) Referring Phys:  [id number] Rometta Emery Diagnosing Phys: Arnoldo Hooker MD IMPRESSIONS  1. Left ventricular ejection fraction, by estimation, is 45 to 50%. The left ventricle has mildly decreased function. The left ventricle has no regional wall motion abnormalities. Left ventricular diastolic parameters were normal.  2. Right ventricular systolic function is normal. The right ventricular size is normal. There is normal pulmonary artery systolic pressure.  3. The mitral valve is normal in structure. Trivial mitral valve regurgitation.  4. The aortic valve is normal in structure. Aortic valve regurgitation is trivial. FINDINGS  Left Ventricle: Left ventricular ejection fraction, by estimation, is 45 to 50%. The left ventricle has mildly decreased function. The left ventricle has no regional wall motion abnormalities. The left ventricular internal cavity size was normal in size. There is no left ventricular hypertrophy. Left ventricular diastolic parameters were normal. Right Ventricle: The right ventricular size is normal. No increase in right ventricular wall thickness. Right ventricular systolic function is normal. There is normal pulmonary artery systolic pressure. The tricuspid regurgitant velocity is 1.36 m/s, and  with an assumed right atrial pressure of 10 mmHg, the estimated right ventricular systolic pressure is 17.4 mmHg. Left Atrium: Left atrial size was normal in size. Right Atrium: Right atrial size was normal in size. Pericardium: There is no evidence of pericardial effusion. Mitral Valve: The mitral valve is normal in  structure. Trivial mitral valve regurgitation. Tricuspid Valve: The tricuspid valve is normal in structure. Tricuspid valve regurgitation is trivial. Aortic Valve: The aortic valve is normal in structure. Aortic valve regurgitation is trivial. Aortic valve mean gradient measures 4.0 mmHg. Aortic valve peak gradient measures 6.6 mmHg. Aortic valve area, by VTI measures 1.98 cm. Pulmonic Valve: The pulmonic valve was normal in structure. Pulmonic valve regurgitation is not visualized. Aorta: The aortic root and ascending aorta are structurally normal, with no evidence of dilitation. IAS/Shunts: No atrial level shunt detected by color flow Doppler.  LEFT VENTRICLE PLAX 2D LVIDd:         4.64 cm  Diastology LVIDs:         3.36 cm  LV e' lateral:   12.10 cm/s LV PW:         0.90 cm  LV E/e' lateral: 7.2 LV IVS:  0.89 cm  LV e' medial:    12.70 cm/s LVOT diam:     2.00 cm  LV E/e' medial:  6.9 LV SV:         44 LV SV Index:   21 LVOT Area:     3.14 cm  RIGHT VENTRICLE RV S prime:     11.20 cm/s TAPSE (M-mode): 3.4 cm LEFT ATRIUM             Index       RIGHT ATRIUM           Index LA diam:        3.20 cm 1.55 cm/m  RA Area:     19.60 cm LA Vol (A2C):   47.1 ml 22.83 ml/m RA Volume:   58.90 ml  28.55 ml/m LA Vol (A4C):   38.1 ml 18.47 ml/m LA Biplane Vol: 45.4 ml 22.01 ml/m  AORTIC VALVE                   PULMONIC VALVE AV Area (Vmax):    2.09 cm    PV Vmax:        0.84 m/s AV Area (Vmean):   1.97 cm    PV Peak grad:   2.8 mmHg AV Area (VTI):     1.98 cm    RVOT Peak grad: 5 mmHg AV Vmax:           128.00 cm/s AV Vmean:          93.600 cm/s AV VTI:            0.224 m AV Peak Grad:      6.6 mmHg AV Mean Grad:      4.0 mmHg LVOT Vmax:         85.00 cm/s LVOT Vmean:        58.800 cm/s LVOT VTI:          0.141 m LVOT/AV VTI ratio: 0.63  AORTA Ao Root diam: 2.80 cm MITRAL VALVE               TRICUSPID VALVE MV Area (PHT): 3.34 cm    TR Peak grad:   7.4 mmHg MV Decel Time: 227 msec    TR Vmax:        136.00  cm/s MV E velocity: 87.30 cm/s MV A velocity: 31.00 cm/s  SHUNTS MV E/A ratio:  2.82        Systemic VTI:  0.14 m                            Systemic Diam: 2.00 cm Arnoldo HookerBruce Bonny Egger MD Electronically signed by Arnoldo HookerBruce Jaquelin Meaney MD Signature Date/Time: 07/14/2020/12:19:42 PM    Final    ECHOCARDIOGRAM LIMITED  Result Date: 07/16/2020    ECHOCARDIOGRAM LIMITED REPORT   Patient Name:   Adam Sparks Date of Exam: 07/16/2020 Medical Rec #:  [id number]    Height:       71.0 in Accession #:    [id number]   Weight:       189.8 lb Date of Birth:  11/01/2001     BSA:          2.062 m Patient Age:    19 years     BP:           95/57 mmHg Patient Gender: M            HR:  78 bpm. 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  Sonographer:     Johnathan Hausen Referring Phys:  [id number] Tresa Moore Diagnosing Phys: Arnoldo Hooker MD IMPRESSIONS  1. Left ventricular ejection fraction, by estimation, is 50 to 55%. The left ventricle has low normal function. The left ventricle has no regional wall motion abnormalities.  2. Right ventricular systolic function is normal. The right ventricular size is normal. There is normal pulmonary artery systolic pressure.  3. The mitral valve is normal in structure. Trivial mitral valve regurgitation.  4. The aortic valve is normal in structure. Aortic valve regurgitation is not visualized. FINDINGS  Left Ventricle: Left ventricular ejection fraction, by estimation, is 50 to 55%. The left ventricle has low normal function. The left ventricle has no regional wall motion abnormalities. The left ventricular internal cavity size was normal in size. There is no left ventricular hypertrophy. Right Ventricle: The right ventricular size is normal. No increase in right ventricular wall thickness. Right ventricular systolic function is normal.  There is normal pulmonary artery systolic pressure. The tricuspid regurgitant velocity is 2.33 m/s, and  with an assumed right atrial pressure of 10 mmHg, the estimated right ventricular systolic pressure is 31.7 mmHg. Left Atrium: Left atrial size was normal in size. Right Atrium: Right atrial size was normal in size. Mitral Valve: The mitral valve is normal in structure. Trivial mitral valve regurgitation. Tricuspid Valve: The tricuspid valve is normal in structure. Tricuspid valve regurgitation is trivial. Aortic Valve: The aortic valve is normal in structure. Aortic valve regurgitation is not visualized. Pulmonic Valve: The pulmonic valve was normal in structure. Aorta: The aortic root and ascending aorta are structurally normal, with no evidence of dilitation. IAS/Shunts: No atrial level shunt detected by color flow Doppler. IVC IVC diam: 1.97 cm TRICUSPID VALVE TR Peak grad:   21.7 mmHg TR Vmax:        233.00 cm/s Arnoldo Hooker MD Electronically signed by Arnoldo Hooker MD Signature Date/Time: 07/16/2020/7:29:00 PM    Final      Assessment and Recommendation  19 y.o. 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  No further cardiac intervention at this time due to normal LV systolic function despite inflammatory viral myocarditis symptoms 2.  Continuation of colchicine at this time for anti-inflammatory for 2 months 3.  Continue low-dose beta-blocker 4.  Possible cardiac MRI as outpatient if necessary although current a patient recovering well  with no further need for intervention at this time.  If ambulating well today would be fine for discharge home on current medical regimen with follow-up in 2 weeks  Signed, Arnoldo Hooker M.D. FACC

## 2020-07-17 NOTE — Plan of Care (Signed)
Reviewed contents during AVS education.  Questions asked and answered. Ulis Rias, RN 07/17/2020 2:17 PM

## 2020-07-17 NOTE — Discharge Instructions (Signed)
Pericarditis Pericarditis  La pericarditis es la inflamacin del pericardio. El pericardio es un saco delgado y de doble capa, lleno de lquido, que rodea Film/video editor. El pericardio protege y sostiene al corazn en la cavidad torcica. La inflamacin del pericardio puede causar roce (friccin) [employer] capas al latir Film/video editor. Tambin es posible que se acumule lquido entre las capas del saco (derrame pericrdico). Esta afeccin puede ser de tres tipos:  Pericarditis aguda. La inflamacin aparece de forma repentina y causa derrame pericrdico.  Pericarditis crnica. La inflamacin puede desarrollarse lentamente o puede continuar despus de una pericarditis aguda y durar ms de 31mses.  Pericarditis constrictiva (poco frecuente). Las capas del pericardio se endurecen y forman tejido cicatricial. El tejido cicatricial se hace ms grueso y se pPsychologist, clinical Esto dificulta el funcionamiento normal del corazn. En la mHovnanian Enterprises la pericarditis es aSwedeny no es grave. La pericarditis crnica y la pericarditis constrictiva son ms graves y pManufacturing engineerrequerir tClinical research associate Cules son las causas? En la mHovnanian Enterprises se desconoce la causa de esta afeccin. Si se encuentra una causa, puede ser una de las siguientes:  Infeccin por un virus.  Ataque cardaco (infarto de miocardio).  Problemas despus de uQatara corazn abierto.  Lesin torcica.  Trastorno autoinmunitario. El sistema de defensa del cuerpo (sistema inmunitario) ataca los tejidos sanos. Como ejemplos, se puede incluir el lupus y la artritis reumatoide.  Insuficiencia renal.  [place] actividad de la glndula tiroidea (hipotiroidismo).  Cncer que se ha diseminado (metstasis) hasta el pericardio desde otra parte del cuerpo.  Tratamiento con rayos X de alta energa (radiacin).  Determinados medicamentos, entre ellos, algunos medicamentos para tratar las convulsiones, anticoagulantes, medicamentos para el  corazn y antibiticos.  Infeccin por hongos o bacterias (poco frecuente). Qu incrementa el riesgo? Los siguientes factores pueden hacer que usted sea ms propenso a tBest boyesta afeccin:  Ser varn.  Tener entre 20y 50aos.  Haber tenido pericarditis con anterioridad.  Haber tenido una infeccin reciente en las vas respiratorias superiores. Cules son los signos o los sntomas? El sntoma principal de esta afeccin es el dolor en el pecho. El dolor en el pecho puede:  Presentarse en el centro o en el costado izquierdo del pecho.  No desaparecer con el reposo.  Durar m[employer]  Empeorar al recostarse y mejorar al incorporarse e inclinarse hacia adelante.  Empeorar al tragar.  Propagarse a la espalda, el cuello o el hombro. Otros sntomas pueden incluir lo siguiente:  Tos crnica y seca.  Palpitaciones. Estas pueden sentirse como latidos rpidos, golpeteos o aleteo.  Mareos o dClorox Company  Cansancio o fatiga.  FCristy Hilts  Respiracin rpida.  Falta de aire al estar recostado. Cmo se diagnostica? Esta afeccin se diagnostica en funcin de los antecedentes mdicos, un examen fsico y pruebas de diagnstico. Durante el examen fsico, el mdico lo auscultar para dHydrographic surveyorfriccin al latir eFilm/video editor(roce pericrdico). Tambin pueden hDillard's que incluyen los siguientes:  Anlisis de sangre para dHydrographic surveyorsignos de infeccin e inflamacin.  Electrocardiograma (ECG). Esta prueba mide la actividad elctrica del corazn.  Ecocardiograma. Esta utiliza ondas sonoras para producir imgenes del corazn.  Exploracin por tomografa computarizada (TC).  Resonancia magntica (RM).  Un cultivo de lquido pericrdico.  La extraccin de uTruddie Cocode tejido del pericardio para examinarla con un microscopio (biopsia). Si los estudios muestran que puede tener pericarditis constrictiva, es posible que le introduzcan un tubo delgado y pequeo en el corazn  para confirmar el diagnstico (cateterismo cardaco). Cmo se trata? El tratamiento de esta afeccin depende de la causa y del tipo de pericarditis que tenga. En la [employer], la pericarditis aguda desaparece por s sola. El tratamiento puede incluir lo siguiente:  Limitar la actividad fsica.  Medicamentos, por ejemplo: ? Antiinflamatorios no esteroideos Dayna Ramus) para Best boy y la inflamacin. ? Corticoesteroides para reducir la inflamacin. ? Colchicina para Best boy y la inflamacin.  Un procedimiento para extraer lquido mediante una aguja (pericardiocentesis) si la acumulacin de lquido presiona el corazn.  Ciruga para eliminar parte del pericardio en el caso de una pericarditis constrictiva. Si la causa de la pericarditis es otra afeccin, usted tambin puede necesitar tratamiento para esa afeccin. Siga estas indicaciones en su casa:  Limite sus actividades como se lo haya indicado el mdico hasta que los sntomas mejoren. Generalmente, esto implica: ? Airline pilot sentado la mayor parte del da. ? No hacer caminatas largas. ? No hacer ejercicio. ? No practicar deportes.  Los deportistas pueden tener que limitar la competencia durante varios meses.  No consuma ningn producto que contenga nicotina o tabaco, como cigarrillos y Psychologist, sport and exercise. Si necesita ayuda para dejar de fumar, consulte al mdico.  Siga una dieta cardiosaludable. Consulte a su nutricionista cules son los alimentos saludables para el corazn.  Tome los medicamentos de venta libre y los recetados solamente como se lo haya indicado el mdico. ? Si le recetaron un antibitico, tmelo como se lo haya indicado el mdico. No deje de tomar el antibitico aunque comience a sentirse mejor.  Concurra a todas las visitas de control como se lo haya indicado el mdico. Esto es importante. Comunquese con un mdico si:  Sigue teniendo sntomas de  pericarditis.  Presenta nuevos sntomas de pericarditis.  Los sntomas empeoran.  Tiene fiebre. Solicite ayuda de inmediato si:  Tiene dolor en el pecho que se intensifica.  Tiene dificultad para respirar.  Se desmaya. Estos sntomas pueden representar un problema grave que constituye Engineer, maintenance (IT). No espere a ver si los sntomas desaparecen. Solicite atencin mdica de inmediato. Comunquese con el servicio de emergencias de su localidad (911 en los [place]). No conduzca por sus propios medios [employer] hospital. Resumen  La pericarditis es la inflamacin del pericardio.  El pericardio es un saco delgado y de doble capa, lleno de lquido, que rodea Film/video editor.  El sntoma principal de esta afeccin es el dolor en el pecho.  El tratamiento de esta afeccin depende de la causa y del tipo de pericarditis que tenga. Esta informacin no tiene Marine scientist el consejo del mdico. Asegrese de hacerle al mdico cualquier pregunta que tenga. Document Revised: 02/06/2018 Document Reviewed: 02/06/2018 Elsevier Patient Education  2020 [employer].

## 2020-07-17 NOTE — Discharge Summary (Signed)
Physician Discharge Summary  Adam Sparks EUM:[id number] DOB: 2001-04-14 DOA: 07/13/2020  PCP: System, Pcp Not In  Admit date: 07/13/2020 Discharge date: 07/17/2020  Admitted From: Home Disposition: Home  Recommendations for Outpatient Follow-up:  1. Follow up with PCP in 1-2 weeks 2. Follow up with cardiology Dr. Gwen Sparks  Home Health:No Equipment/Devices:None Discharge Condition:Stable CODE STATUS:Full Diet recommendation: Heart Healthy Brief/Interim Summary: VQM:GQQP Pernudiis a 19 y.o.malewith medical history significant ofdepression with anxiety who had acute viral illness staff syndromes in the last 3 days and not having generalized aches and fever. Patient has been unable to sleep adequately. He has problem with sleep and has been on trazodone. He noted fever and chills. Now he is having anterior chest pain especially with deep breath. Also with mobility. He is now having shortness of breath again. Denied any IV drug abuse. He has been vaccinated to Covid with the father vaccine. Denied any prior cardiac history or any family history of cardiac disease. Patient was in the ER evaluated and had markedly elevated troponin with ST elevation diffusely throughout the leads. Cardiology consulted including STEMI team. Suggestion is patient is having probably post viral pericarditis. Recommended medical admission with evaluation including echocardiogram. Cardiology to follow and make further recommendations. Chest discomfort is more of pain now rated as 5 out of 10. No radiation. Worsened with deep breath but relieved by resting comfortably..  7/30: Patient seen and examined.  Still endorsing chest pain.  Troponins continue to uptrend.  Last troponin greater than 15,000.  7/31: Patient seen and examined.  Troponin down trended to 6000 and then spiked back to 10,000.  Subsequently downtrending.  Chest pain improved.  EKG findings also improved.  Remains on heparin infusion.   Seen by cardiology.  8/1: Patient seen and examined.  Troponin is now trending down to 3000.  Chest pain improved.  Patient continues to endorse mild soreness in the chest especially when coughing or sitting up but improved from prior.  8/2: Patient seen and examined the day of discharge.  Chest pain is almost completely resolved.  Troponins downtrending.  Taken off infusion yesterday.  Repeat echocardiogram ordered and reviewed by cardiology.  Given normal ejection fraction with lack of wall motion abnormalities patient is cleared for discharge home at this time.  He will follow up as an outpatient for consideration of cardiac MRI.  He will be discharged on colchicine and ibuprofen for treatment of viral myocarditis.  Discharge Diagnoses:  Principal Problem:   Acute pericarditis Active Problems:   Sinus tachycardia   Hypokalemia   Troponin I above reference range  Suspected acute myopericarditis Initial EKG demonstrates mild diffusely elevated ST segment elevation Troponin initially 4000. up trending to 11,000 and subsequently 15,000 Cardiology consulted, communicated with Dr. Gwen Sparks Echocardiogram with normal LV function Troponin peaked at 15,000, now downtrending Latest down to 3000 Heparin infusion run for 48 hours for cardioprotection Repeat echocardiogram still demonstrates normal LV function Seen by cardiology, cleared for discharge Will discharge on ibuprofen 800 mg 3 times daily and colchicine 0.6 mg 3 times daily Follow-up with cardiology as directed Consideration for cardiac MRI as outpatient   Elevated troponins Likely secondary to myopericarditis ACS felt unlikely with normal LV function on echo    Hypokalemia Check daily BMP, replete as necessary  Discharge Instructions  Discharge Instructions    Diet - low sodium heart healthy   Complete by: As directed    Increase activity slowly   Complete by: As directed      Allergies  as of 07/17/2020   No Known  Allergies     Medication List    TAKE these medications   buPROPion 150 MG 24 hr tablet Commonly known as: WELLBUTRIN XL Take 150 mg by mouth every morning.   clonazePAM 1 MG tablet Commonly known as: KLONOPIN Take 1 mg by mouth at bedtime.   colchicine 0.6 MG tablet Take 1 tablet (0.6 mg total) by mouth 2 (two) times daily.   ibuprofen 800 MG tablet Commonly known as: ADVIL Take 1 tablet (800 mg total) by mouth 3 (three) times daily for 14 days.   metoprolol tartrate 25 MG tablet Commonly known as: LOPRESSOR Take 0.5 tablets (12.5 mg total) by mouth 2 (two) times daily.   traZODone 50 MG tablet Commonly known as: DESYREL Take 25 tablets by mouth at bedtime.       Follow-up Information    Adam Blinks, MD. Schedule an appointment as soon as possible for a visit on 07/25/2020.   Specialty: Cardiology Why: Appointment at 2:15pm Contact information: [address] [facility] [place] [address] [phone]              No Known Allergies  Consultations:  Cardiology- Adam Sparks   Procedures/Studies: DG Chest Portable 1 View  Result Date: 07/13/2020 CLINICAL DATA:  19 year old male with shortness of breath. EXAM: PORTABLE CHEST 1 VIEW COMPARISON:  None. FINDINGS: The heart size and mediastinal contours are within normal limits. Both lungs are clear. The visualized skeletal structures are unremarkable. IMPRESSION: No active disease. Electronically Signed   By: Elgie Collard M.D.   On: 07/13/2020 16:00   ECHOCARDIOGRAM COMPLETE  Result Date: 07/14/2020    ECHOCARDIOGRAM REPORT   Patient Name:   Adam Sparks Date of Exam: 07/14/2020 Medical Rec #:  [id number]    Height:       71.0 in Accession #:    [id number]   Weight:       190.0 lb Date of Birth:  2001/10/02     BSA:          2.063 m Patient Age:    19 years     BP:           138/90 mmHg Patient Gender: M            HR:           103 bpm. Exam Location:  [facility] Procedure: 2D Echo,  Cardiac Doppler and Color Doppler Indications:     Pericardial effusion 423.9  History:         Patient has no prior history of Echocardiogram examinations. No                  medical history on file.  Sonographer:     Adam Sparks RDCS (AE) Referring Phys:  [id number] Rometta Emery Diagnosing Phys: Arnoldo Hooker MD IMPRESSIONS  1. Left ventricular ejection fraction, by estimation, is 45 to 50%. The left ventricle has mildly decreased function. The left ventricle has no regional wall motion abnormalities. Left ventricular diastolic parameters were normal.  2. Right ventricular systolic function is normal. The right ventricular size is normal. There is normal pulmonary artery systolic pressure.  3. The mitral valve is normal in structure. Trivial mitral valve regurgitation.  4. The aortic valve is normal in structure. Aortic valve regurgitation is trivial. FINDINGS  Left Ventricle: Left ventricular ejection fraction, by estimation, is 45 to 50%. The left ventricle has mildly decreased function. The left ventricle has  no regional wall motion abnormalities. The left ventricular internal cavity size was normal in size. There is no left ventricular hypertrophy. Left ventricular diastolic parameters were normal. Right Ventricle: The right ventricular size is normal. No increase in right ventricular wall thickness. Right ventricular systolic function is normal. There is normal pulmonary artery systolic pressure. The tricuspid regurgitant velocity is 1.36 m/s, and  with an assumed right atrial pressure of 10 mmHg, the estimated right ventricular systolic pressure is 17.4 mmHg. Left Atrium: Left atrial size was normal in size. Right Atrium: Right atrial size was normal in size. Pericardium: There is no evidence of pericardial effusion. Mitral Valve: The mitral valve is normal in structure. Trivial mitral valve regurgitation. Tricuspid Valve: The tricuspid valve is normal in structure. Tricuspid valve regurgitation is trivial.  Aortic Valve: The aortic valve is normal in structure. Aortic valve regurgitation is trivial. Aortic valve mean gradient measures 4.0 mmHg. Aortic valve peak gradient measures 6.6 mmHg. Aortic valve area, by VTI measures 1.98 cm. Pulmonic Valve: The pulmonic valve was normal in structure. Pulmonic valve regurgitation is not visualized. Aorta: The aortic root and ascending aorta are structurally normal, with no evidence of dilitation. IAS/Shunts: No atrial level shunt detected by color flow Doppler.  LEFT VENTRICLE PLAX 2D LVIDd:         4.64 cm  Diastology LVIDs:         3.36 cm  LV e' lateral:   12.10 cm/s LV PW:         0.90 cm  LV E/e' lateral: 7.2 LV IVS:        0.89 cm  LV e' medial:    12.70 cm/s LVOT diam:     2.00 cm  LV E/e' medial:  6.9 LV SV:         44 LV SV Index:   21 LVOT Area:     3.14 cm  RIGHT VENTRICLE RV S prime:     11.20 cm/s TAPSE (M-mode): 3.4 cm LEFT ATRIUM             Index       RIGHT ATRIUM           Index LA diam:        3.20 cm 1.55 cm/m  RA Area:     19.60 cm LA Vol (A2C):   47.1 ml 22.83 ml/m RA Volume:   58.90 ml  28.55 ml/m LA Vol (A4C):   38.1 ml 18.47 ml/m LA Biplane Vol: 45.4 ml 22.01 ml/m  AORTIC VALVE                   PULMONIC VALVE AV Area (Vmax):    2.09 cm    PV Vmax:        0.84 m/s AV Area (Vmean):   1.97 cm    PV Peak grad:   2.8 mmHg AV Area (VTI):     1.98 cm    RVOT Peak grad: 5 mmHg AV Vmax:           128.00 cm/s AV Vmean:          93.600 cm/s AV VTI:            0.224 m AV Peak Grad:      6.6 mmHg AV Mean Grad:      4.0 mmHg LVOT Vmax:         85.00 cm/s LVOT Vmean:        58.800 cm/s LVOT VTI:  0.141 m LVOT/AV VTI ratio: 0.63  AORTA Ao Root diam: 2.80 cm MITRAL VALVE               TRICUSPID VALVE MV Area (PHT): 3.34 cm    TR Peak grad:   7.4 mmHg MV Decel Time: 227 msec    TR Vmax:        136.00 cm/s MV E velocity: 87.30 cm/s MV A velocity: 31.00 cm/s  SHUNTS MV E/A ratio:  2.82        Systemic VTI:  0.14 m                            Systemic  Diam: 2.00 cm Arnoldo Hooker MD Electronically signed by Arnoldo Hooker MD Signature Date/Time: 07/14/2020/12:19:42 PM    Final    ECHOCARDIOGRAM LIMITED  Result Date: 07/16/2020    ECHOCARDIOGRAM LIMITED REPORT   Patient Name:   Adam Sparks Date of Exam: 07/16/2020 Medical Rec #:  [id number]    Height:       71.0 in Accession #:    [id number]   Weight:       189.8 lb Date of Birth:  [birth year]     BSA:          2.062 m Patient Age:    19 years     BP:           95/57 mmHg Patient Gender: M            HR:           78 bpm. 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  Sonographer:     Johnathan Hausen Referring Phys:  [id number] Tresa Moore Diagnosing Phys: Arnoldo Hooker MD IMPRESSIONS  1. Left ventricular ejection fraction, by estimation, is 50 to 55%. The left ventricle has low normal function. The left ventricle has no regional wall motion abnormalities.  2. Right ventricular systolic function is normal. The right ventricular size is normal. There is normal pulmonary artery systolic pressure.  3. The mitral valve is normal in structure. Trivial mitral valve regurgitation.  4. The aortic valve is normal in structure. Aortic valve regurgitation is not visualized. FINDINGS  Left Ventricle: Left ventricular ejection fraction, by estimation, is 50 to 55%. The left ventricle has low normal function. The left ventricle has no regional wall motion abnormalities. The left ventricular internal cavity size was normal in size. There is no left ventricular hypertrophy. Right Ventricle: The right ventricular size is normal. No increase in right ventricular wall thickness. Right ventricular systolic function is normal. There is normal pulmonary artery systolic pressure. The tricuspid regurgitant velocity is 2.33 m/s, and  with an assumed right atrial pressure of 10  mmHg, the estimated right ventricular systolic pressure is 31.7 mmHg. Left Atrium: Left atrial size was normal in size. Right Atrium: Right atrial size was normal in size. Mitral Valve: The mitral valve is normal in structure. Trivial mitral valve regurgitation. Tricuspid Valve: The tricuspid valve is normal in structure. Tricuspid valve regurgitation is trivial. Aortic Valve: The aortic valve is normal in structure. Aortic valve regurgitation is not visualized. Pulmonic Valve: The pulmonic valve was normal in structure. Aorta: The aortic  root and ascending aorta are structurally normal, with no evidence of dilitation. IAS/Shunts: No atrial level shunt detected by color flow Doppler. IVC IVC diam: 1.97 cm TRICUSPID VALVE TR Peak grad:   21.7 mmHg TR Vmax:        233.00 cm/s Arnoldo HookerBruce Kowalski MD Electronically signed by Arnoldo HookerBruce Kowalski MD Signature Date/Time: 07/16/2020/7:29:00 PM    Final     (Echo, Carotid, EGD, Colonoscopy, ERCP)    Subjective: Patient seen and examined the day of discharge.  Chest pain almost clinically resolved.  Stable for discharge home.  Discharge Exam: Vitals:   07/17/20 0734 07/17/20 1128  BP: 108/66 103/61  Pulse: 63 63  Resp: 20 20  Temp: 98.6 F (37 C) 98.4 F (36.9 C)  SpO2: 96% 96%   Vitals:   07/16/20 1914 07/17/20 0558 07/17/20 0734 07/17/20 1128  BP: 115/72 (!) 110/62 108/66 103/61  Pulse: 75 65 63 63  Resp: 20 20 20 20   Temp: 98.6 F (37 C) 98 F (36.7 C) 98.6 F (37 C) 98.4 F (36.9 C)  TempSrc:  Oral Oral Oral  SpO2: 96% 97% 96% 96%  Weight:      Height:        General: Pt is alert, awake, not in acute distress Cardiovascular: RRR, S1/S2 +, no rubs, no gallops Respiratory: CTA bilaterally, no wheezing, no rhonchi Abdominal: Soft, NT, ND, bowel sounds + Extremities: no edema, no cyanosis    The results of significant diagnostics from this hospitalization (including imaging, microbiology, ancillary and laboratory) are listed below for  reference.     Microbiology: Recent Results (from the past 240 hour(s))  SARS Coronavirus 2 by RT PCR (hospital order, performed [facility]) Nasopharyngeal Nasopharyngeal Swab     Status: None   Collection Time: 07/13/20  3:44 PM   Specimen: Nasopharyngeal Swab  Result Value Ref Range Status   SARS Coronavirus 2 NEGATIVE NEGATIVE Final    Comment: (NOTE) SARS-CoV-2 target nucleic acids are NOT DETECTED.  The SARS-CoV-2 RNA is generally detectable in upper and lower respiratory specimens during the acute phase of infection. The lowest concentration of SARS-CoV-2 viral copies this assay can detect is 250 copies / mL. A negative result does not preclude SARS-CoV-2 infection and should not be used as the sole basis for treatment or other patient management decisions.  A negative result may occur with improper specimen collection / handling, submission of specimen other than nasopharyngeal swab, presence of viral mutation(s) within the areas targeted by this assay, and inadequate number of viral copies (<250 copies / mL). A negative result must be combined with clinical observations, patient history, and epidemiological information.  Fact Sheet for Patients:   BoilerBrush.com.cyhttps://www.fda.gov/media/136312/download  Fact Sheet for Healthcare Providers: https://pope.com/https://www.fda.gov/media/136313/download  This test is not yet approved or  cleared by the [place] FDA and has been authorized for detection and/or diagnosis of SARS-CoV-2 by FDA under an Emergency Use Authorization (EUA).  This EUA will remain in effect (meaning this test can be used) for the duration of the COVID-19 declaration under Section 564(b)(1) of the Act, 21 U.S.C. section 360bbb-3(b)(1), unless the authorization is terminated or revoked sooner.  Performed at [facility], [address] [place], [place] [id number]      Labs: BNP (last 3 results) No results for input(s): BNP in the last 8760  hours. Basic Metabolic Panel: Recent Labs  Lab 07/13/20 1544 07/14/20 0344 07/15/20 0435 07/16/20 0509 07/17/20 0525  NA 134* 138 134* 139 139  K 3.2* 3.0* 3.8 4.2 3.5  CL 100 101 105 111 108  CO2 20* GLUCOSE 122* 113* 94 91 83  BUN CREATININE 1.03 0.92 0.89 0.92 0.78  CALCIUM 9.4 8.6* 8.3* 8.5* 8.3*  MG  --   --  2.1 2.0 2.0   Liver Function Tests: Recent Labs  Lab 07/14/20 0344  AST 95*  ALT 31  ALKPHOS 47  BILITOT 0.7  PROT 7.6  ALBUMIN 3.8   No results for input(s): LIPASE, AMYLASE in the last 168 hours. No results for input(s): AMMONIA in the last 168 hours. CBC: Recent Labs  Lab 07/13/20 1544 07/14/20 0344 07/15/20 0435 07/16/20 0509 07/17/20 0525  WBC 9.5 8.0 6.9 7.8 8.1  NEUTROABS 6.1  --  3.0 3.5 4.4  HGB 16.8 15.3 13.1 12.3* 12.5*  HCT 49.6 45.4 37.3* 35.2* 35.9*  MCV 88.4 88.8 88.2 88.9 88.2  PLT 289 300 279 266 324   Cardiac Enzymes: No results for input(s): CKTOTAL, CKMB, CKMBINDEX, TROPONINI in the last 168 hours. BNP: Invalid input(s): POCBNP CBG: No results for input(s): GLUCAP in the last 168 hours. D-Dimer No results for input(s): DDIMER in the last 72 hours. Hgb A1c No results for input(s): HGBA1C in the last 72 hours. Lipid Profile No results for input(s): CHOL, HDL, LDLCALC, TRIG, CHOLHDL, LDLDIRECT in the last 72 hours. Thyroid function studies No results for input(s): TSH, T4TOTAL, T3FREE, THYROIDAB in the last 72 hours.  Invalid input(s): FREET3 Anemia work up No results for input(s): VITAMINB12, FOLATE, FERRITIN, TIBC, IRON, RETICCTPCT in the last 72 hours. Urinalysis    Component Value Date/Time   COLORURINE Yellow 05/11/2014 1655   APPEARANCEUR Clear 05/11/2014 1655   LABSPEC 1.024 05/11/2014 1655   PHURINE 6.0 05/11/2014 1655   GLUCOSEU Negative 05/11/2014 1655   HGBUR Negative 05/11/2014 1655   BILIRUBINUR Negative 05/11/2014 1655   KETONESUR Negative 05/11/2014 1655   PROTEINUR  Negative 05/11/2014 1655   NITRITE Negative 05/11/2014 1655   LEUKOCYTESUR Negative 05/11/2014 1655   Sepsis Labs Invalid input(s): PROCALCITONIN,  WBC,  LACTICIDVEN Microbiology Recent Results (from the past 240 hour(s))  SARS Coronavirus 2 by RT PCR (hospital order, performed in [facility] hospital lab) Nasopharyngeal Nasopharyngeal Swab     Status: None   Collection Time: 07/13/20  3:44 PM   Specimen: Nasopharyngeal Swab  Result Value Ref Range Status   SARS Coronavirus 2 NEGATIVE NEGATIVE Final    Comment: (NOTE) SARS-CoV-2 target nucleic acids are NOT DETECTED.  The SARS-CoV-2 RNA is generally detectable in upper and lower respiratory specimens during the acute phase of infection. The lowest concentration of SARS-CoV-2 viral copies this assay can detect is 250 copies / mL. A negative result does not preclude SARS-CoV-2 infection and should not be used as the sole basis for treatment or other patient management decisions.  A negative result may occur with improper specimen collection / handling, submission of specimen other than nasopharyngeal swab, presence of viral mutation(s) within the areas targeted by this assay, and inadequate number of viral copies (<250 copies / mL). A negative result must be combined with clinical observations, patient history, and epidemiological information.  Fact Sheet for Patients:   BoilerBrush.com.cy  Fact Sheet for Healthcare Providers: https://pope.com/  This test is not yet approved or  cleared by the [place] FDA and has been authorized for detection and/or diagnosis of SARS-CoV-2 by FDA under an Emergency Use Authorization (EUA).  This EUA  will remain in effect (meaning this test can be used) for the duration of the COVID-19 declaration under Section 564(b)(1) of the Act, 21 U.S.C. section 360bbb-3(b)(1), unless the authorization is terminated or revoked sooner.  Performed at  [facility], [address] [place], [place] [address]      Time coordinating discharge: Over 30 minutes  SIGNED:   Tresa Moore, MD  [facility] 07/17/2020, 5:30 PM Pager   If 7PM-7AM, please contact night-coverage

## 2021-12-25 IMAGING — DX DG CHEST 1V PORT
1 series · 1 of 1 positions shown · non-contrast
Comparison: None.

CLINICAL DATA: 19-year-old male with shortness of breath.

EXAM:
PORTABLE CHEST 1 VIEW

[chest ap]
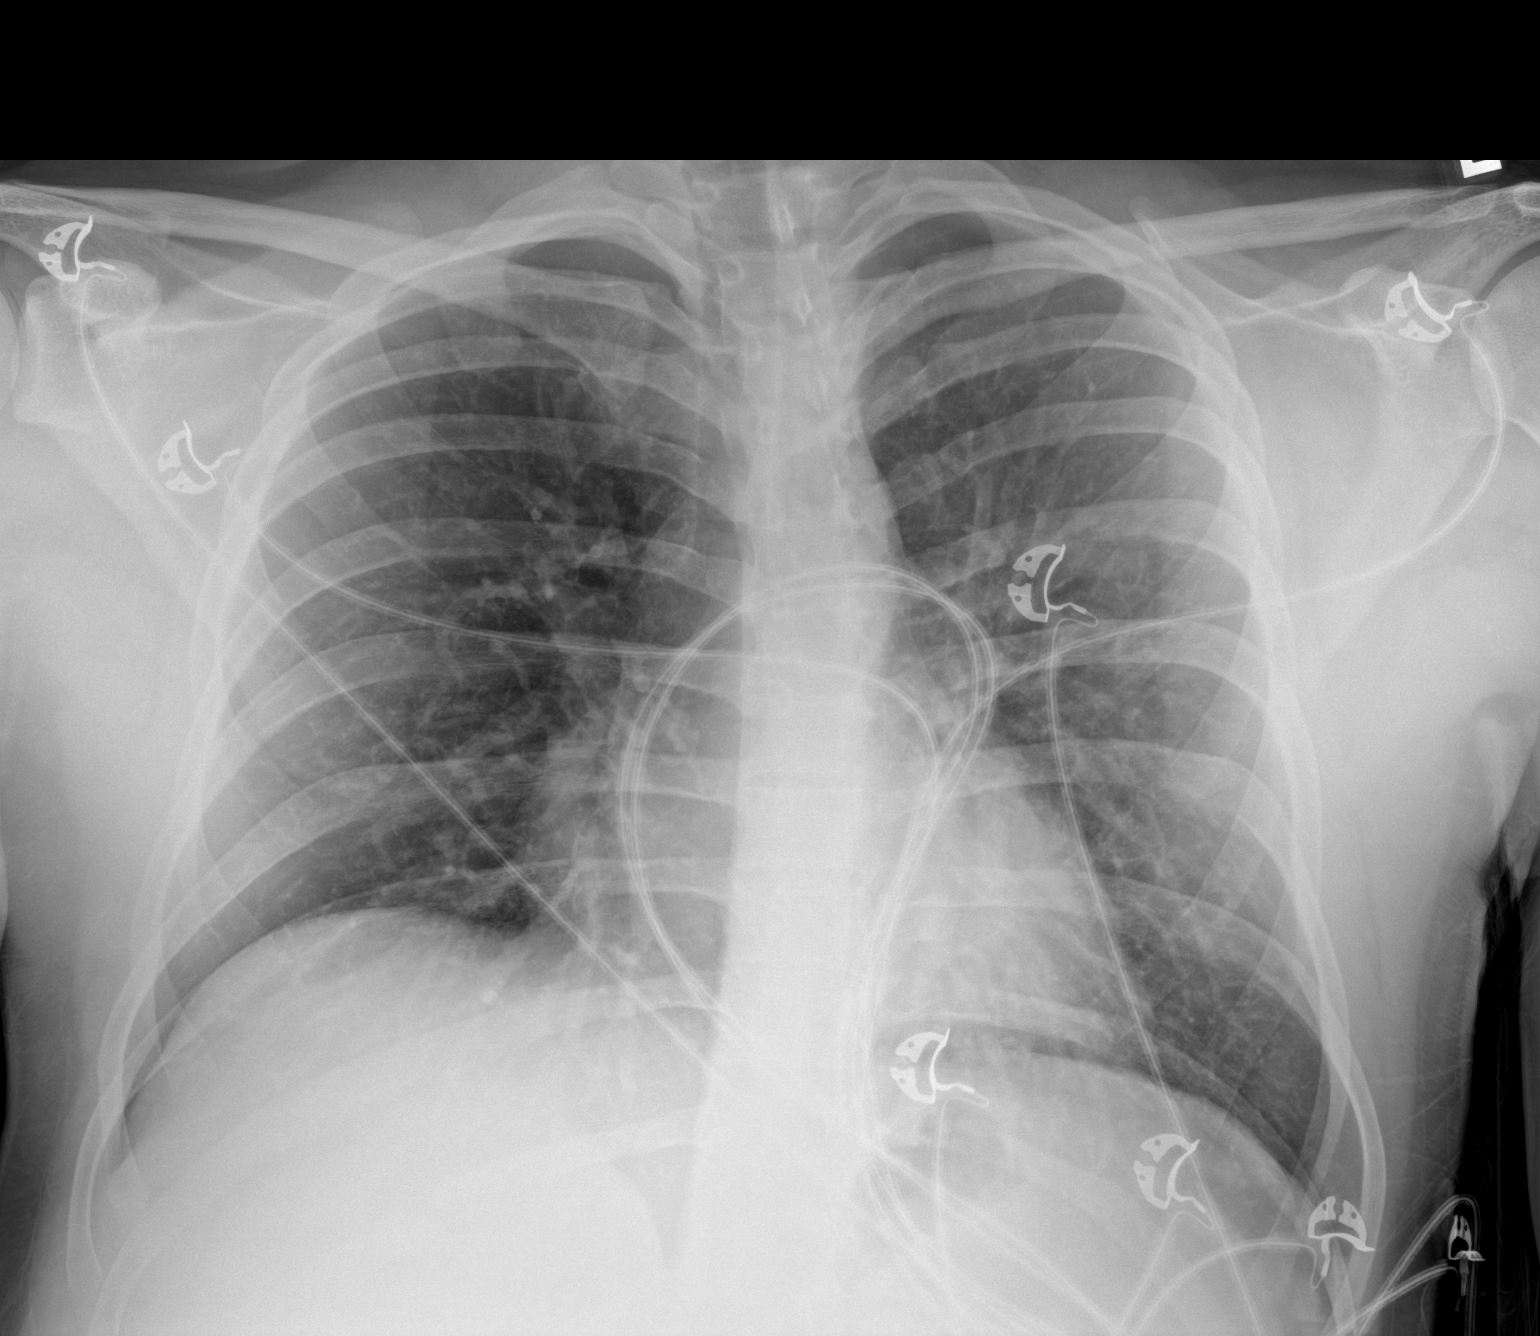

[1 of 1 positions shown; findings below may reference images not displayed]

FINDINGS: The heart size and mediastinal contours are within normal limits.
Both lungs are clear. The visualized skeletal structures are
unremarkable.
IMPRESSION: No active disease.
